# Patient Record
Sex: Female | Born: 1985 | Race: White | Hispanic: No | Marital: Single | Smoking: Never smoker
Health system: Southern US, Community
[De-identification: ages and names within clinical notes are randomized; demographics above are authoritative.]

## PROBLEM LIST (undated history)

## (undated) DIAGNOSIS — I10 Essential (primary) hypertension: Secondary | ICD-10-CM

## (undated) DIAGNOSIS — J449 Chronic obstructive pulmonary disease, unspecified: Secondary | ICD-10-CM

## (undated) DIAGNOSIS — M1990 Unspecified osteoarthritis, unspecified site: Secondary | ICD-10-CM

## (undated) DIAGNOSIS — D690 Allergic purpura: Secondary | ICD-10-CM

## (undated) DIAGNOSIS — R519 Headache, unspecified: Secondary | ICD-10-CM

## (undated) DIAGNOSIS — E119 Type 2 diabetes mellitus without complications: Secondary | ICD-10-CM

## (undated) DIAGNOSIS — J302 Other seasonal allergic rhinitis: Secondary | ICD-10-CM

## (undated) DIAGNOSIS — R51 Headache: Secondary | ICD-10-CM

---

## 2014-10-16 ENCOUNTER — Encounter (HOSPITAL_COMMUNITY): Payer: Self-pay | Admitting: *Deleted

## 2014-10-16 ENCOUNTER — Emergency Department (HOSPITAL_COMMUNITY)
Admission: EM | Admit: 2014-10-16 | Discharge: 2014-10-16 | Disposition: A | Discharge status: Home / Self Care | Payer: Self-pay | Attending: Emergency Medicine | Admitting: Emergency Medicine

## 2014-10-16 DIAGNOSIS — Z880 Allergy status to penicillin: Secondary | ICD-10-CM | POA: Insufficient documentation

## 2014-10-16 DIAGNOSIS — R739 Hyperglycemia, unspecified: Secondary | ICD-10-CM

## 2014-10-16 DIAGNOSIS — L02211 Cutaneous abscess of abdominal wall: Secondary | ICD-10-CM | POA: Insufficient documentation

## 2014-10-16 DIAGNOSIS — E1165 Type 2 diabetes mellitus with hyperglycemia: Secondary | ICD-10-CM | POA: Insufficient documentation

## 2014-10-16 DIAGNOSIS — Z794 Long term (current) use of insulin: Secondary | ICD-10-CM | POA: Insufficient documentation

## 2014-10-16 DIAGNOSIS — L0291 Cutaneous abscess, unspecified: Secondary | ICD-10-CM

## 2014-10-16 DIAGNOSIS — I10 Essential (primary) hypertension: Secondary | ICD-10-CM | POA: Insufficient documentation

## 2014-10-16 HISTORY — DX: Essential (primary) hypertension: I10

## 2014-10-16 HISTORY — DX: Type 2 diabetes mellitus without complications: E11.9

## 2014-10-16 HISTORY — DX: Other seasonal allergic rhinitis: J30.2

## 2014-10-16 LAB — CBG MONITORING, ED: GLUCOSE-CAPILLARY: 252 mg/dL — AB (ref 65–99)

## 2014-10-16 MED ORDER — LIDOCAINE-EPINEPHRINE (PF) 2 %-1:200000 IJ SOLN
20.0000 mL | Freq: Once | INTRAMUSCULAR | Status: AC
  Administered 2014-10-16: 20 mL
  Filled 2014-10-16 (×2): qty 20

## 2014-10-16 NOTE — ED Notes (Signed)
Pt left at this time with all belongings.  

## 2014-10-16 NOTE — ED Notes (Signed)
CBG 252 mg/dL reported to [employer] and Nelagoney PA

## 2014-10-16 NOTE — Discharge Instructions (Signed)
Abscess Care After An abscess (also called a boil or furuncle) is an infected area that contains a collection of pus. Signs and symptoms of an abscess include pain, tenderness, redness, or hardness, or you may feel a moveable soft area under your skin. An abscess can occur anywhere in the body. The infection may spread to surrounding tissues causing cellulitis. A cut (incision) by the surgeon was made over your abscess and the pus was drained out. Gauze may have been packed into the space to provide a drain that will allow the cavity to heal from the inside outwards. The boil may be painful for 5 to 7 days. Most people with a boil do not have high fevers. Your abscess, if seen early, may not have localized, and may not have been lanced. If not, another appointment may be required for this if it does not get better on its own or with medications. HOME CARE INSTRUCTIONS   Only take over-the-counter or prescription medicines for pain, discomfort, or fever as directed by your caregiver.  When you bathe, soak and then remove gauze or iodoform packs at least daily or as directed by your caregiver. You may then wash the wound gently with mild soapy water. Repack with gauze or do as your caregiver directs. SEEK IMMEDIATE MEDICAL CARE IF:   You develop increased pain, swelling, redness, drainage, or bleeding in the wound site.  You develop signs of generalized infection including muscle aches, chills, fever, or a general ill feeling.  An oral temperature above 102 F (38.9 C) develops, not controlled by medication. See your caregiver for a recheck if you develop any of the symptoms described above. If medications (antibiotics) were prescribed, take them as directed. Document Released: 09/03/2004 Document Revised: 05/10/2011 Document Reviewed: 05/01/2007 [facility] Patient Information 2015 [place], [place]. This information is not intended to replace advice given to you by your health care provider. Make sure  you discuss any questions you have with your health care provider.   Emergency Department Resource Guide 1) Find a Doctor and Pay Out of Pocket Although you won't have to find out who is covered by your insurance plan, it is a good idea to ask around and get recommendations. You will then need to call the office and see if the doctor you have chosen will accept you as a new patient and what types of options they offer for patients who are self-pay. Some doctors offer discounts or will set up payment plans for their patients who do not have insurance, but you will need to ask so you aren't surprised when you get to your appointment.  2) Contact Your Local Health Department Not all health departments have doctors that can see patients for sick visits, but many do, so it is worth a call to see if yours does. If you don't know where your local health department is, you can check in your phone book. The CDC also has a tool to help you locate your state's health department, and many state websites also have listings of all of their local health departments.  3) Find a [facility] If your illness is not likely to be very severe or complicated, you may want to try a walk in clinic. These are popping up all over the country in pharmacies, drugstores, and shopping centers. They're usually staffed by nurse practitioners or physician assistants that have been trained to treat common illnesses and complaints. They're usually fairly quick and inexpensive. However, if you have serious medical issues  or chronic medical problems, these are probably not your best option. ° °No Primary Care Doctor: °- Call [facility] Connect at  832-8000 - they can help you locate a primary care doctor that  accepts your insurance, provides certain services, etc. °- Physician Referral Service- 1-800-533-3463 ° °Chronic Pain Problems: °Organization         Address  Phone   Notes  °[facility]  (336) 297-2271 Patients need  to be referred by their primary care doctor.  ° °Medication Assistance: °Organization         Address  Phone   Notes  °[place] [facility] [address] °[place], [place] [address] (336) 641-8030 --Must be a resident of [place] °-- Must have NO insurance coverage whatsoever (no Medicaid/ Medicare, etc.) °-- The pt. MUST have a primary care doctor that directs their care regularly and follows them in the community °  °[facility]  (866) 331-1348   °[employer]  (888) 892-1162   ° °Agencies that provide inexpensive medical care: °Organization         Address  Phone   Notes  °[facility]  (336) 832-8035   °[facility]    (336) 832-7272   °[facility] [address] °[place], [place] [address] (336) 832-4777   °[facility] [address], °[place] (336) 271-4999   °[facility]    (336) 373-0678   °[facility]    (336) 272-1050   °[facility] ° [address], [place] Phone:  (336) 832-4444, Fax:  (336) 832-4440 Hours of Operation:  9 am - 6 pm, M-F.  Also accepts Medicaid/Medicare and self-pay.  °[facility] ° [address], [place] Phone: (336) 832-3150, Fax: (336) 832-3151. Hours of Operation:  8:30 am - 5:30 pm, M-F.  Also accepts Medicaid and self-pay.  °[facility] [place] [address], [place] Phone: (336) 878-6027   °[facility] [address], [place], [place] (336)723-1848, Ext. 123 Mondays & Thursdays: 7-9 AM.  First 15 patients are seen on a first come, first serve basis. °  ° °Medicaid-accepting [place] Providers: ° °Organization         Address  Phone   Notes  °[facility] [address], [place] (336) 641-2100 Also accepts self-pay patients.  °[facility] [address], [place] ° (336) 856-9996   °[facility] [address], [place] (336) 288-8857   °[facility] [address] [place] [address], [place] (336) 299-7000   °[facility] [address], [place]  ° (336) 373-1557 Only accepts [place] Access Medicaid patients after they have their name applied to their card.  ° °Self-Pay (no insurance) in [place]: ° °Organization         Address  Phone   Notes  °Sickle Cell Patients, [facility] [address], [place] (336) 832-1970   °[facility] [address], [place] (336) 832-4400   °[facility] ° [address], [place] (336) 992-4800   °[facility]/Dr. Osei-Bonsu ° [address] [place] [address], [place] or [address], [place] (336) 841-8500 Phone number for both [place] and  locations is the same.  °  [facility] [address], [place] (336) 299-0000   °[facility] [address] [place] [address], [place] or [address] (336) 852-7530 °(336) 878-2260   °[facility] [address], [place] (336) 350-1642, phone; (336) 294-5005, fax Sees patients 1st and 3rd Saturday of every month.  Must not qualify for public or private insurance (i.e. Medicaid, Medicare, [place] Health Choice, Veterans' Benefits) • Household income should be no more than 200% of the poverty level •The clinic cannot treat you if you are pregnant or think you are pregnant • Sexually transmitted diseases are not treated at the clinic.  ° ° °Dental Care: °Organization         Address  Phone  Notes  °[facility] [address], [place] (336) 641-6152 Accepts children up to age 21 who are enrolled in Medicaid or [place] Health Choice; pregnant women with a Medicaid card; and children who have applied for Medicaid or [place] Health Choice, but were declined, whose  parents can pay a reduced fee at time of service.  °[facility] [place]  [address], [place] (336) 641-7733 Accepts children up to age 21 who are enrolled in Medicaid or [place] Health Choice; pregnant women with a Medicaid card; and children who have applied for Medicaid or [place] Health Choice, but were declined, whose parents can pay a reduced fee at time of service.  °[facility] ° [address], [place] (336) 641-4533 Patients are seen by appointment only. Walk-ins are not accepted. [facility] will see patients 18 years of age and older. °Monday - Tuesday (8am-5pm) °Most Wednesdays (8:30-5pm) °$30 per visit, cash only  °[facility] ° [address], [place] (336) 641-4533 Patients are seen by appointment only. Walk-ins are not accepted. [facility] will see patients 18 years of age and older. °One Wednesday Evening (Monthly: Volunteer Based).  $30 per visit, cash only  °[facility]  (919) 537-3737 for adults; Children under age 4, call Graduate Pediatric Dentistry at (919) [phone]. Children aged 4-14, please call (919) [phone] to request a pediatric application. ° Dental services are provided in all areas of dental care including fillings, crowns and bridges, complete and partial dentures, implants, gum treatment, root canals, and extractions. Preventive care is also provided. Treatment is provided to both adults and children. °Patients are selected via a lottery and there is often a waiting list. °  °[facility] [address], ° ° (336) 763-8833 [email or web address] °  °[facility] [address], [place], [place] (336)723-1848, Ext. 123 Second and Fourth Thursday of each month, opens at 6:30 AM; Clinic ends at 9 AM.  Patients are seen on a first-come first-served basis, and a limited number are seen during each clinic.  ° °[facility] °  [address], [place], [place] (336) 723-7904   Eligibility Requirements °You must have lived in [place], [place], or [place] counties for at least the last three months. °  You cannot be eligible for state or federal sponsored healthcare insurance, including Veterans Administration, Medicaid, or Medicare. °  You generally cannot be eligible for healthcare insurance through your employer.  °  How to apply: °Eligibility screenings are held every Tuesday and Wednesday afternoon from 1:00 pm until 4:00 pm. You do not need an appointment for the interview!  °  [facility] [address], [place], [place] 336-631-2330   °[facility]  336-342-8273   °[facility]  336-703-3100   °[facility]  336-570-6415   ° °Behavioral Health Resources in the Community: °Intensive Outpatient Programs °Organization         Address  Phone  Notes  °[facility] [address], [place], [place] 336-878-6098   °[facility] [address], [place], [place] 336-832-9800   °[facility] [address], [place], [place] ° 336-882-2125   °[facility] [address],  °[place], [place] 1-800-853-5163 or 336-641-4981   °Substance Abuse Resources °Organization         Address  Phone  Notes  °[facility]  336-882-2125   °[facility]  336-784-9470   °[facility]  336-285-9073   °[facility]  336-845-3988   °Residential & Outpatient Substance Abuse Program  1-800-659-3381   °Psychological Services °Organization         Address  Phone  Notes  °[facility]  336- 832-9600   °[facility]  336- 378-7881   °[facility] [address], [place] 1-800-853-5163 or 336-641-4981   ° °Mobile Crisis Teams °Organization         Address  Phone  Notes  °[facility], Mobile Crisis Care Unit  1-877-626-1772     °Assertive °[facility] ° [address] [place], [place] 336-834-9664   °Sharon DeEsch [address] °[place] 336-554-5454   ° °Self-Help/Support Groups °Organization         Address  Phone             Notes  °[facility] - variety of support groups  336- 373-1402 Call for more information  °Narcotics Anonymous (NA), [facility] [address], °[place]  2 meetings at this location  ° °Residential Treatment Programs °Organization         Address  Phone  Notes  °[facility] [address],    °[place]  1-866-801-8205   °[facility] ° [address], [place], [place] 704-293-8524   °[facility] [address], [place] 336-845-3988 Admissions: 8am-3pm M-F  °[facility] [address]    °[place], [place] 336-841-1104   °[facility] [address], [place], [place] 336-379-7146   °[facility] [address]  °[place], [place] 336-285-9073   °[facility] Intensive Outpatient [address], [place], [place] 336-852-3033   °[facility] [address]  °[place], [place] 1-877-615-2722 or 336-784-9470   °[facility] [address] [place], [place] 336-227-7417 Accepts Medicaid  °[facility] [address]  °[place] 1-800-659-3381 Substance Abuse/Addiction Treatment  ° °[place] Behavioral Health Resources °Organization         Address  Phone  Notes  °[facility]  (888) 581-9988   °Julie Brannon, PhD [address] [place], [place]   (336) 349-5553 or (336) 951-0000   °[facility]   [address] °[place],  (336) 349-4454   °[facility] [address], [place],  (336) 342-8316 Insurance/Medicaid/sponsorship through [facility]  °[facility] [address]                                       [place], [place] [phone] Therapy/tele-psych/case  [facility] [address]   [place], [place] [phone]    Dr. Lolly Mustache  724-739-0883   [facility]  [employer] [facility] Dept. 1) [address],  2) [address], [place] 3)  [address], [place] [phone]) [phone]  306-099-7141   [facility] Child Abuse Hotline 272-882-9294 or 714-143-4226 (After Hours)       Please monitor for new or worsening signs of infection. Follow-up immediately if any present. Please monitor your blood sugar, take your insulin as directed.

## 2014-10-16 NOTE — ED Provider Notes (Signed)
CSN: [id number]     Arrival date & time 10/16/14  1702 History  This chart was scribed for Eyvonne Mechanic, PA-C working with Donnetta Hutching, MD by Evon Slack, ED Scribe. This patient was seen in room TR11C/TR11C and the patient's care was started at 5:40 PM.      Chief Complaint  Patient presents with  . Recurrent Skin Infections   The history is provided by the patient. No language interpreter was used.   HPI Comments:   Diane Nguyen is a 29 y.o. female who presents to the Emergency Department complaining of abscess to her lower abdomen onset 2 days prior. Pt states that she tried to pop the abscess with no relief. Pt denies fever, chills, nausea or vomiting.  Pt states that she has a Hx of DM and that her blood sugars not controlled. She states she has not checked her blood sugar in over 2 months. Pt states that she has not been compliant with her medications, she states that she only takes her insulin 1x per day.     Past Medical History  Diagnosis Date  . Hypertension   . Diabetes mellitus without complication   . Seasonal allergies    History reviewed. No pertinent past surgical history. No family history on file. Social History  Substance Use Topics  . Smoking status: Never Smoker   . Smokeless tobacco: None  . Alcohol Use: Yes   OB History    No data available     Review of Systems  All other systems reviewed and are negative.   Allergies  Bee venom; Penicillins; Soy allergy; and Sulfur  Home Medications   Prior to Admission medications   Not on File   BP 100/66 mmHg  Pulse 96  Temp(Src) 98.4 F (36.9 C) (Oral)  Resp 20  Ht  (1.651 m)  Wt 264 lb 14.4 oz (120.158 kg)  BMI 44.08 kg/m2  SpO2 100%  LMP 09/05/2014   Physical Exam  Constitutional: She is oriented to person, place, and time. She appears well-developed and well-nourished. No distress.  HENT:  Head: Normocephalic and atraumatic.  Eyes: Conjunctivae and EOM are normal.  Neck: Neck  supple. No tracheal deviation present.  Cardiovascular: Normal rate.   Pulmonary/Chest: Effort normal. No respiratory distress.  Musculoskeletal: Normal range of motion.  Neurological: She is alert and oriented to person, place, and time.  Skin: Skin is warm and dry.  Small abscess on lower with small amount of induration no surrounding cellulitis, fluctuance, erythema or red streaking.   Psychiatric: She has a normal mood and affect. Her behavior is normal.  Nursing note and vitals reviewed.   ED Course  Procedures (including critical care time)   INCISION AND DRAINAGE PROCEDURE NOTE: Patient identification was confirmed and verbal consent was obtained. This procedure was performed by Eyvonne Mechanic, PA-C at 6:10 PM. Site: lower abdomen Sterile procedures observed: Yes Needle size: 27 Anesthetic used (type and amt): Lidocaine 2% with Epi Blade size: 11 Drainage: .5 cc Complexity: Complex Packing used: none Site anesthetized, incision made over site, wound drained and explored loculations, rinsed with copious amounts of normal saline, wound packed with sterile gauze, covered with dry, sterile dressing.  Pt tolerated procedure well without complications.  Instructions for care discussed verbally and pt provided with additional written instructions for homecare and f/u.  Labs Review Labs Reviewed  CBG MONITORING, ED - Abnormal; Notable for the following:    Glucose-Capillary 252 (*)    All other components  within normal limits    Imaging Review No results found. I have personally reviewed and evaluated these images and lab results as part of my medical decision-making.   EKG Interpretation None      MDM   Final diagnoses:  Abscess  Hyperglycemia    Labs: CBG- 252  Imaging:   Consults:  Therapeutics:   Discharge Meds:   Assessment/Plan: Patient here for abscess to her abdomen, no signs of surrounding cellulitis. Minimal purulent drainage, patient given  wound care instructions. Patient reports she is a diabetic and has not been checking her blood sugar, pointing care today showed 252, she has no changes in vision, abdominal pain, increased thirst or urination. Patient does have her diabetic medication but has not been taking it twice daily as indicated as she has limited supply. Patient encouraged to take her medication as directed, follow-up with primary care for reevaluation and assistance with obtaining medications. Patient reports she does have a prescription, but is unable to afford it.       Eyvonne Mechanic, PA-C 10/16/14 1943  Elwin Mocha, MD 10/17/14 [phone]

## 2014-10-16 NOTE — ED Notes (Signed)
Pt noted to have an asbcess to left lower abdomen.  Pt states that she has hx of same.

## 2015-03-05 ENCOUNTER — Other Ambulatory Visit: Payer: Self-pay | Admitting: Internal Medicine

## 2017-08-03 ENCOUNTER — Other Ambulatory Visit: Payer: Self-pay | Admitting: Neurosurgery

## 2017-08-23 NOTE — Pre-Procedure Instructions (Addendum)
Tulsi Crossett  08/23/2017      [id number] - [place], [place] - [address] [facility] [place] [address] Phone: [phone] Fax: [phone]    Your procedure is scheduled on Tues., August 30, 2017 from 8:00AM-10:42AM  Report to [facility] Admitting Entrance "A" at 6:00AM  Call this number if you have problems the morning of surgery:  [phone]   Remember:  Do not eat or drink after midnight on July 1st    Take these medicines the morning of surgery with A SIP OF WATER: If needed Omeprazole (PRILOSEC OTC), Eye Itch Relief, Fluticasone (FLONASE), Cetirizine (ZYRTEC),  and Albuterol Inhaler (Bring with you the day of surgery)  As of today, stop taking all Other Aspirin Products, Vitamins, Fish oils, and Herbal medications. Also stop all NSAIDS i.e. Advil, Ibuprofen, Motrin, Aleve, Anaprox, Naproxen, BC, Goody Powders, and all Supplements.  How to Manage Your Diabetes Before and After Surgery  Why is it important to control my blood sugar before and after surgery? . Improving blood sugar levels before and after surgery helps healing and can limit problems. . A way of improving blood sugar control is eating a healthy diet by: o  Eating less sugar and carbohydrates o  Increasing activity/exercise o  Talking with your doctor about reaching your blood sugar goals . High blood sugars (greater than 180 mg/dL) can raise your risk of infections and slow your recovery, so you will need to focus on controlling your diabetes during the weeks before surgery. . Make sure that the doctor who takes care of your diabetes knows about your planned surgery including the date and location.  How do I manage my blood sugar before surgery? . Check your blood sugar at least 4 times a day, starting 2 days before surgery, to make sure that the level is not too high or low. o Check your blood sugar the morning of your surgery when you wake up and every 2 hours  until you get to the Short Stay unit. . If your blood sugar is less than 70 mg/dL, you will need to treat for low blood sugar: o Do not take insulin. o Treat a low blood sugar (less than 70 mg/dL) with  cup of clear juice (cranberry or apple), 4 glucose tablets, OR glucose gel. Recheck blood sugar in 15 minutes after treatment (to make sure it is greater than 70 mg/dL). If your blood sugar is not greater than 70 mg/dL on recheck, call [phone] o  for further instructions. . Report your blood sugar to the short stay nurse when you get to Short Stay.  . If you are admitted to the hospital after surgery: o Your blood sugar will be checked by the staff and you will probably be given insulin after surgery (instead of oral diabetes medicines) to make sure you have good blood sugar levels. o The goal for blood sugar control after surgery is 80-180 mg/dL.  WHAT DO I DO ABOUT MY DIABETES MEDICATION?  Marland Kitchen Do not take GlipiZIDE (GLUCOTROL), JARDIANCE, and MetFORMIN (GLUCOPHAGE) the morning of surgery.  . If your CBG is greater than 220 mg/dL, inform the staff upon arrival to Short Stay  Reviewed and Endorsed by [facility] Patient Education Committee, August 2015   Do not wear jewelry, make-up or nail polish.  Do not wear lotions, powders, or perfumes, or deodorant.  Do not shave 48 hours prior to surgery.    Do not bring  valuables to the hospital.  [facility] is not responsible for any belongings or valuables.  Contacts, dentures or bridgework may not be worn into surgery.  Leave your suitcase in the car.  After surgery it may be brought to your room.  For patients admitted to the hospital, discharge time will be determined by your treatment team.  Patients discharged the day of surgery will not be allowed to drive home.   Special instructions:   [place]- Preparing For Surgery  Before surgery, you can play an important role. Because skin is not sterile, your skin needs to be as free  of germs as possible. You can reduce the number of germs on your skin by washing with CHG (chlorahexidine gluconate) Soap before surgery.  CHG is an antiseptic cleaner which kills germs and bonds with the skin to continue killing germs even after washing.    Oral Hygiene is also important to reduce your risk of infection.  Remember - BRUSH YOUR TEETH THE MORNING OF SURGERY WITH YOUR REGULAR TOOTHPASTE  Please do not use if you have an allergy to CHG or antibacterial soaps. If your skin becomes reddened/irritated stop using the CHG.  Do not shave (including legs and underarms) for at least 48 hours prior to first CHG shower. It is OK to shave your face.  Please follow these instructions carefully.   1. Shower the NIGHT BEFORE SURGERY and the MORNING OF SURGERY with CHG.   2. If you chose to wash your hair, wash your hair first as usual with your normal shampoo.  3. After you shampoo, rinse your hair and body thoroughly to remove the shampoo.  4. Use CHG as you would any other liquid soap. You can apply CHG directly to the skin and wash gently with a scrungie or a clean washcloth.   5. Apply the CHG Soap to your body ONLY FROM THE NECK DOWN.  Do not use on open wounds or open sores. Avoid contact with your eyes, ears, mouth and genitals (private parts). Wash Face and genitals (private parts)  with your normal soap.  6. Wash thoroughly, paying special attention to the area where your surgery will be performed.  7. Thoroughly rinse your body with warm water from the neck down.  8. DO NOT shower/wash with your normal soap after using and rinsing off the CHG Soap.  9. Pat yourself dry with a CLEAN TOWEL.  10. Wear CLEAN PAJAMAS to bed the night before surgery, wear comfortable clothes the morning of surgery  11. Place CLEAN SHEETS on your bed the night of your first shower and DO NOT SLEEP WITH PETS.  Day of Surgery:  Do not apply any deodorants/lotions.  Please wear clean clothes to the  hospital/surgery center.   Remember to brush your teeth WITH YOUR REGULAR TOOTHPASTE.  Please read over the following fact sheets that you were given. Pain Booklet, Coughing and Deep Breathing, MRSA Information and Surgical Site Infection Prevention

## 2017-08-24 ENCOUNTER — Inpatient Hospital Stay (HOSPITAL_COMMUNITY)
Admission: RE | Admit: 2017-08-24 | Discharge: 2017-08-24 | Disposition: A | Discharge status: Home / Self Care | Payer: Self-pay | Source: Ambulatory Visit

## 2017-08-29 ENCOUNTER — Encounter (HOSPITAL_COMMUNITY)
Admission: RE | Admit: 2017-08-29 | Discharge: 2017-08-29 | Disposition: A | Discharge status: Home / Self Care | Payer: Medicaid Other | Source: Ambulatory Visit | Attending: Neurosurgery | Admitting: Neurosurgery

## 2017-08-29 ENCOUNTER — Encounter (HOSPITAL_COMMUNITY): Payer: Self-pay

## 2017-08-29 ENCOUNTER — Other Ambulatory Visit: Payer: Self-pay

## 2017-08-29 HISTORY — DX: Headache: R51

## 2017-08-29 HISTORY — DX: Headache, unspecified: R51.9

## 2017-08-29 HISTORY — DX: Unspecified osteoarthritis, unspecified site: M19.90

## 2017-08-29 HISTORY — DX: Chronic obstructive pulmonary disease, unspecified: J44.9

## 2017-08-29 HISTORY — DX: Allergic purpura: D69.0

## 2017-08-29 LAB — CBC WITH DIFFERENTIAL/PLATELET
Abs Immature Granulocytes: 0 10*3/uL (ref 0.0–0.1)
BASOS ABS: 0.1 10*3/uL (ref 0.0–0.1)
BASOS PCT: 1 %
EOS ABS: 0.1 10*3/uL (ref 0.0–0.7)
EOS PCT: 1 %
HCT: 46.6 % — ABNORMAL HIGH (ref 36.0–46.0)
Hemoglobin: 15.2 g/dL — ABNORMAL HIGH (ref 12.0–15.0)
Immature Granulocytes: 0 %
Lymphocytes Relative: 40 %
Lymphs Abs: 3.5 10*3/uL (ref 0.7–4.0)
MCH: 27.5 pg (ref 26.0–34.0)
MCHC: 32.6 g/dL (ref 30.0–36.0)
MCV: 84.4 fL (ref 78.0–100.0)
Monocytes Absolute: 0.6 10*3/uL (ref 0.1–1.0)
Monocytes Relative: 7 %
NEUTROS PCT: 51 %
Neutro Abs: 4.4 10*3/uL (ref 1.7–7.7)
PLATELETS: 296 10*3/uL (ref 150–400)
RBC: 5.52 MIL/uL — AB (ref 3.87–5.11)
RDW: 12.3 % (ref 11.5–15.5)
WBC: 8.7 10*3/uL (ref 4.0–10.5)

## 2017-08-29 LAB — TYPE AND SCREEN
ABO/RH(D): B POS
ANTIBODY SCREEN: NEGATIVE

## 2017-08-29 LAB — GLUCOSE, CAPILLARY: Glucose-Capillary: 174 mg/dL — ABNORMAL HIGH (ref 70–99)

## 2017-08-29 LAB — SURGICAL PCR SCREEN
MRSA, PCR: NEGATIVE
Staphylococcus aureus: POSITIVE — AB

## 2017-08-29 LAB — BASIC METABOLIC PANEL
ANION GAP: 10 (ref 5–15)
BUN: 11 mg/dL (ref 6–20)
CHLORIDE: 105 mmol/L (ref 98–111)
CO2: 23 mmol/L (ref 22–32)
Calcium: 9.1 mg/dL (ref 8.9–10.3)
Creatinine, Ser: 0.59 mg/dL (ref 0.44–1.00)
GFR calc Af Amer: >60 mL/min (ref >60)
Glucose, Bld: 151 mg/dL — ABNORMAL HIGH (ref 70–99)
POTASSIUM: 4 mmol/L (ref 3.5–5.1)
SODIUM: 138 mmol/L (ref 135–145)

## 2017-08-29 LAB — ABO/RH: ABO/RH(D): B POS

## 2017-08-29 MED ORDER — VANCOMYCIN HCL 10 G IV SOLR
1500.0000 mg | INTRAVENOUS | Status: AC
  Administered 2017-08-30 (×2): 1500 mg via INTRAVENOUS
  Filled 2017-08-29: qty 1500

## 2017-08-29 NOTE — Progress Notes (Addendum)
PCP: Cletis AthensMelissa Rose, NP  Cardiologist: pt denies  EKG: 08/29/17  Stress test: pt denies  ECHO: pt denies  Cardiac Cath: pt denies  Chest x-ray: pt denies

## 2017-08-30 ENCOUNTER — Inpatient Hospital Stay (HOSPITAL_COMMUNITY): Payer: Medicaid Other

## 2017-08-30 ENCOUNTER — Inpatient Hospital Stay (HOSPITAL_COMMUNITY): Payer: Medicaid Other | Admitting: Anesthesiology

## 2017-08-30 ENCOUNTER — Inpatient Hospital Stay (HOSPITAL_COMMUNITY): Payer: Medicaid Other | Admitting: Emergency Medicine

## 2017-08-30 ENCOUNTER — Inpatient Hospital Stay (HOSPITAL_COMMUNITY)
Admission: RE | Admit: 2017-08-30 | Discharge: 2017-08-31 | DRG: 454 | Disposition: A | Discharge status: Home / Self Care | Payer: Medicaid Other | Attending: Neurosurgery | Admitting: Neurosurgery

## 2017-08-30 ENCOUNTER — Encounter (HOSPITAL_COMMUNITY): Payer: Self-pay | Admitting: Anesthesiology

## 2017-08-30 ENCOUNTER — Inpatient Hospital Stay (HOSPITAL_COMMUNITY)
Admission: RE | Disposition: A | Discharge status: Home / Self Care | Payer: Self-pay | Source: Home / Self Care | Attending: Neurosurgery

## 2017-08-30 DIAGNOSIS — M1990 Unspecified osteoarthritis, unspecified site: Secondary | ICD-10-CM | POA: Diagnosis present

## 2017-08-30 DIAGNOSIS — I10 Essential (primary) hypertension: Secondary | ICD-10-CM | POA: Diagnosis present

## 2017-08-30 DIAGNOSIS — Z880 Allergy status to penicillin: Secondary | ICD-10-CM | POA: Diagnosis not present

## 2017-08-30 DIAGNOSIS — E669 Obesity, unspecified: Secondary | ICD-10-CM | POA: Diagnosis present

## 2017-08-30 DIAGNOSIS — E119 Type 2 diabetes mellitus without complications: Secondary | ICD-10-CM | POA: Diagnosis present

## 2017-08-30 DIAGNOSIS — Z419 Encounter for procedure for purposes other than remedying health state, unspecified: Secondary | ICD-10-CM

## 2017-08-30 DIAGNOSIS — M4317 Spondylolisthesis, lumbosacral region: Secondary | ICD-10-CM | POA: Diagnosis present

## 2017-08-30 DIAGNOSIS — Z7984 Long term (current) use of oral hypoglycemic drugs: Secondary | ICD-10-CM

## 2017-08-30 DIAGNOSIS — J449 Chronic obstructive pulmonary disease, unspecified: Secondary | ICD-10-CM | POA: Diagnosis present

## 2017-08-30 DIAGNOSIS — M4800 Spinal stenosis, site unspecified: Secondary | ICD-10-CM | POA: Diagnosis present

## 2017-08-30 DIAGNOSIS — Z6841 Body Mass Index (BMI) 40.0 and over, adult: Secondary | ICD-10-CM | POA: Diagnosis not present

## 2017-08-30 DIAGNOSIS — Z882 Allergy status to sulfonamides status: Secondary | ICD-10-CM | POA: Diagnosis not present

## 2017-08-30 DIAGNOSIS — M5117 Intervertebral disc disorders with radiculopathy, lumbosacral region: Secondary | ICD-10-CM | POA: Diagnosis present

## 2017-08-30 DIAGNOSIS — Z79899 Other long term (current) drug therapy: Secondary | ICD-10-CM | POA: Diagnosis not present

## 2017-08-30 DIAGNOSIS — M549 Dorsalgia, unspecified: Secondary | ICD-10-CM | POA: Diagnosis present

## 2017-08-30 DIAGNOSIS — Z91018 Allergy to other foods: Secondary | ICD-10-CM | POA: Diagnosis not present

## 2017-08-30 DIAGNOSIS — Z91030 Bee allergy status: Secondary | ICD-10-CM

## 2017-08-30 DIAGNOSIS — Z9109 Other allergy status, other than to drugs and biological substances: Secondary | ICD-10-CM

## 2017-08-30 LAB — GLUCOSE, CAPILLARY
GLUCOSE-CAPILLARY: 133 mg/dL — AB (ref 70–99)
GLUCOSE-CAPILLARY: 257 mg/dL — AB (ref 70–99)
GLUCOSE-CAPILLARY: 274 mg/dL — AB (ref 70–99)
GLUCOSE-CAPILLARY: 259 mg/dL — AB (ref 70–99)
GLUCOSE-CAPILLARY: 252 mg/dL — AB (ref 70–99)

## 2017-08-30 LAB — POCT PREGNANCY, URINE: PREG TEST UR: NEGATIVE

## 2017-08-30 SURGERY — POSTERIOR LUMBAR FUSION 1 LEVEL
Anesthesia: General | Site: Back

## 2017-08-30 MED ORDER — HYDROMORPHONE HCL 1 MG/ML IJ SOLN
INTRAMUSCULAR | Status: AC
  Filled 2017-08-30: qty 1

## 2017-08-30 MED ORDER — LIDOCAINE 2% (20 MG/ML) 5 ML SYRINGE
INTRAMUSCULAR | Status: AC
  Filled 2017-08-30: qty 5

## 2017-08-30 MED ORDER — BISACODYL 10 MG RE SUPP: 10.0000 mg | Freq: Every day | RECTAL | Status: DC | PRN

## 2017-08-30 MED ORDER — OMEPRAZOLE MAGNESIUM 20 MG PO TBEC: 20.0000 mg | DELAYED_RELEASE_TABLET | Freq: Every day | ORAL | Status: DC | PRN

## 2017-08-30 MED ORDER — EPHEDRINE SULFATE 50 MG/ML IJ SOLN
INTRAMUSCULAR | Status: DC | PRN
  Administered 2017-08-30: 5 mg via INTRAVENOUS
  Administered 2017-08-30: 10 mg via INTRAVENOUS
  Administered 2017-08-30: 25 mg via INTRAVENOUS
  Administered 2017-08-30: 10 mg via INTRAVENOUS

## 2017-08-30 MED ORDER — ROCURONIUM BROMIDE 10 MG/ML (PF) SYRINGE
PREFILLED_SYRINGE | INTRAVENOUS | Status: AC
  Filled 2017-08-30: qty 10

## 2017-08-30 MED ORDER — MIDAZOLAM HCL 5 MG/5ML IJ SOLN
INTRAMUSCULAR | Status: DC | PRN
  Administered 2017-08-30 (×2): 1 mg via INTRAVENOUS

## 2017-08-30 MED ORDER — POLYETHYLENE GLYCOL 3350 17 G PO PACK: 17.0000 g | PACK | Freq: Every day | ORAL | Status: DC | PRN

## 2017-08-30 MED ORDER — DEXAMETHASONE SODIUM PHOSPHATE 10 MG/ML IJ SOLN
10.0000 mg | INTRAMUSCULAR | Status: AC
  Administered 2017-08-30: 10 mg via INTRAVENOUS
  Filled 2017-08-30: qty 1

## 2017-08-30 MED ORDER — PHENYLEPHRINE HCL 10 MG/ML IJ SOLN
INTRAMUSCULAR | Status: DC | PRN
  Administered 2017-08-30 (×2): 120 ug via INTRAVENOUS
  Administered 2017-08-30 (×3): 80 ug via INTRAVENOUS

## 2017-08-30 MED ORDER — ONDANSETRON HCL 4 MG PO TABS: 4.0000 mg | ORAL_TABLET | Freq: Four times a day (QID) | ORAL | Status: DC | PRN

## 2017-08-30 MED ORDER — PROPOFOL 10 MG/ML IV BOLUS
INTRAVENOUS | Status: AC
  Filled 2017-08-30: qty 40

## 2017-08-30 MED ORDER — ESMOLOL HCL 100 MG/10ML IV SOLN
INTRAVENOUS | Status: DC | PRN
  Administered 2017-08-30: 30 mg via INTRAVENOUS

## 2017-08-30 MED ORDER — CHLORHEXIDINE GLUCONATE CLOTH 2 % EX PADS: 6.0000 | MEDICATED_PAD | Freq: Once | CUTANEOUS | Status: DC

## 2017-08-30 MED ORDER — VANCOMYCIN HCL 1 G IV SOLR
INTRAVENOUS | Status: DC | PRN
  Administered 2017-08-30: 1000 mg

## 2017-08-30 MED ORDER — INSULIN ASPART 100 UNIT/ML ~~LOC~~ SOLN
5.0000 [IU] | Freq: Once | SUBCUTANEOUS | Status: AC
  Administered 2017-08-30: 5 [IU] via SUBCUTANEOUS

## 2017-08-30 MED ORDER — LACTATED RINGERS IV SOLN
INTRAVENOUS | Status: DC
  Administered 2017-08-30 (×2): via INTRAVENOUS

## 2017-08-30 MED ORDER — ROCURONIUM BROMIDE 100 MG/10ML IV SOLN
INTRAVENOUS | Status: DC | PRN
  Administered 2017-08-30: 20 mg via INTRAVENOUS
  Administered 2017-08-30 (×2): 30 mg via INTRAVENOUS
  Administered 2017-08-30: 50 mg via INTRAVENOUS

## 2017-08-30 MED ORDER — 0.9 % SODIUM CHLORIDE (POUR BTL) OPTIME
TOPICAL | Status: DC | PRN
  Administered 2017-08-30: 1000 mL

## 2017-08-30 MED ORDER — THROMBIN 20000 UNITS EX SOLR
CUTANEOUS | Status: AC
  Filled 2017-08-30: qty 20000

## 2017-08-30 MED ORDER — BUPIVACAINE HCL (PF) 0.25 % IJ SOLN
INTRAMUSCULAR | Status: AC
  Filled 2017-08-30: qty 30

## 2017-08-30 MED ORDER — SODIUM CHLORIDE 0.9 % IV SOLN: 250.0000 mL | INTRAVENOUS | Status: DC

## 2017-08-30 MED ORDER — DEXAMETHASONE SODIUM PHOSPHATE 10 MG/ML IJ SOLN
INTRAMUSCULAR | Status: AC
  Filled 2017-08-30: qty 1

## 2017-08-30 MED ORDER — THROMBIN 20000 UNITS EX SOLR
CUTANEOUS | Status: DC | PRN
  Administered 2017-08-30: 09:00:00 via TOPICAL

## 2017-08-30 MED ORDER — GLIPIZIDE 10 MG PO TABS
10.0000 mg | ORAL_TABLET | Freq: Two times a day (BID) | ORAL | Status: DC
  Administered 2017-08-30 – 2017-08-31 (×2): 10 mg via ORAL
  Filled 2017-08-30 (×2): qty 1

## 2017-08-30 MED ORDER — HYDROMORPHONE HCL 1 MG/ML IJ SOLN: 1.0000 mg | INTRAMUSCULAR | Status: DC | PRN

## 2017-08-30 MED ORDER — MENTHOL 3 MG MT LOZG: 1.0000 | LOZENGE | OROMUCOSAL | Status: DC | PRN

## 2017-08-30 MED ORDER — FENTANYL CITRATE (PF) 250 MCG/5ML IJ SOLN
INTRAMUSCULAR | Status: AC
Start: 2017-08-30 — End: ?
  Filled 2017-08-30: qty 5

## 2017-08-30 MED ORDER — EPHEDRINE SULFATE 50 MG/ML IJ SOLN
INTRAMUSCULAR | Status: AC
  Filled 2017-08-30: qty 1

## 2017-08-30 MED ORDER — LISINOPRIL 10 MG PO TABS
10.0000 mg | ORAL_TABLET | Freq: Every day | ORAL | Status: DC
Start: 2017-08-30 — End: 2017-08-31
  Administered 2017-08-31: 10 mg via ORAL
  Filled 2017-08-30: qty 1

## 2017-08-30 MED ORDER — FENTANYL CITRATE (PF) 100 MCG/2ML IJ SOLN
INTRAMUSCULAR | Status: DC | PRN
  Administered 2017-08-30 (×3): 50 ug via INTRAVENOUS
  Administered 2017-08-30: 100 ug via INTRAVENOUS

## 2017-08-30 MED ORDER — ONDANSETRON HCL 4 MG/2ML IJ SOLN: 4.0000 mg | Freq: Four times a day (QID) | INTRAMUSCULAR | Status: DC | PRN

## 2017-08-30 MED ORDER — HYDROMORPHONE HCL 1 MG/ML IJ SOLN
0.2500 mg | INTRAMUSCULAR | Status: DC | PRN
  Administered 2017-08-30 (×4): 0.5 mg via INTRAVENOUS

## 2017-08-30 MED ORDER — SUGAMMADEX SODIUM 500 MG/5ML IV SOLN
INTRAVENOUS | Status: DC | PRN
  Administered 2017-08-30: 500 mg via INTRAVENOUS

## 2017-08-30 MED ORDER — ALBUMIN HUMAN 5 % IV SOLN
INTRAVENOUS | Status: DC | PRN
  Administered 2017-08-30: 10:00:00 via INTRAVENOUS

## 2017-08-30 MED ORDER — METFORMIN HCL 500 MG PO TABS
1000.0000 mg | ORAL_TABLET | Freq: Two times a day (BID) | ORAL | Status: DC
  Administered 2017-08-30 – 2017-08-31 (×2): 1000 mg via ORAL
  Filled 2017-08-30 (×2): qty 2

## 2017-08-30 MED ORDER — SODIUM CHLORIDE 0.9 % IV SOLN
INTRAVENOUS | Status: DC | PRN
  Administered 2017-08-30: 500 mL

## 2017-08-30 MED ORDER — KETOTIFEN FUMARATE 0.025 % OP SOLN: 1.0000 [drp] | Freq: Every day | OPHTHALMIC | Status: DC | PRN

## 2017-08-30 MED ORDER — INSULIN ASPART 100 UNIT/ML ~~LOC~~ SOLN
0.0000 [IU] | Freq: Three times a day (TID) | SUBCUTANEOUS | Status: DC
  Administered 2017-08-31: 3 [IU] via SUBCUTANEOUS

## 2017-08-30 MED ORDER — ACETAMINOPHEN 650 MG RE SUPP: 650.0000 mg | RECTAL | Status: DC | PRN

## 2017-08-30 MED ORDER — VANCOMYCIN HCL 1000 MG IV SOLR
INTRAVENOUS | Status: AC
  Filled 2017-08-30: qty 1000

## 2017-08-30 MED ORDER — CANAGLIFLOZIN 100 MG PO TABS
100.0000 mg | ORAL_TABLET | Freq: Every day | ORAL | Status: DC
  Administered 2017-08-31: 100 mg via ORAL
  Filled 2017-08-30: qty 1

## 2017-08-30 MED ORDER — CYCLOBENZAPRINE HCL 10 MG PO TABS: 10.0000 mg | ORAL_TABLET | Freq: Every evening | ORAL | Status: DC | PRN

## 2017-08-30 MED ORDER — PHENOL 1.4 % MT LIQD: 1.0000 | OROMUCOSAL | Status: DC | PRN

## 2017-08-30 MED ORDER — ONDANSETRON HCL 4 MG/2ML IJ SOLN
INTRAMUSCULAR | Status: AC
  Filled 2017-08-30: qty 2

## 2017-08-30 MED ORDER — PHENYLEPHRINE 40 MCG/ML (10ML) SYRINGE FOR IV PUSH (FOR BLOOD PRESSURE SUPPORT)
PREFILLED_SYRINGE | INTRAVENOUS | Status: AC
  Filled 2017-08-30: qty 20

## 2017-08-30 MED ORDER — MIDAZOLAM HCL 2 MG/2ML IJ SOLN
INTRAMUSCULAR | Status: AC
  Filled 2017-08-30: qty 2

## 2017-08-30 MED ORDER — LIDOCAINE HCL (CARDIAC) PF 100 MG/5ML IV SOSY
PREFILLED_SYRINGE | INTRAVENOUS | Status: DC | PRN
  Administered 2017-08-30: 40 mg via INTRAVENOUS

## 2017-08-30 MED ORDER — OXYCODONE HCL 5 MG PO TABS
10.0000 mg | ORAL_TABLET | ORAL | Status: DC | PRN
  Administered 2017-08-30 – 2017-08-31 (×5): 10 mg via ORAL
  Filled 2017-08-30 (×5): qty 2

## 2017-08-30 MED ORDER — ACETAMINOPHEN 325 MG PO TABS
650.0000 mg | ORAL_TABLET | ORAL | Status: DC | PRN
  Administered 2017-08-30 – 2017-08-31 (×2): 650 mg via ORAL
  Filled 2017-08-30 (×2): qty 2

## 2017-08-30 MED ORDER — INSULIN ASPART 100 UNIT/ML ~~LOC~~ SOLN
0.0000 [IU] | Freq: Every day | SUBCUTANEOUS | Status: DC
  Administered 2017-08-30: 3 [IU] via SUBCUTANEOUS

## 2017-08-30 MED ORDER — FLEET ENEMA 7-19 GM/118ML RE ENEM: 1.0000 | ENEMA | Freq: Once | RECTAL | Status: DC | PRN

## 2017-08-30 MED ORDER — FLUTICASONE PROPIONATE 50 MCG/ACT NA SUSP
1.0000 | Freq: Every day | NASAL | Status: DC | PRN
  Filled 2017-08-30: qty 16

## 2017-08-30 MED ORDER — INSULIN ASPART 100 UNIT/ML ~~LOC~~ SOLN
0.0000 [IU] | Freq: Three times a day (TID) | SUBCUTANEOUS | Status: DC
  Administered 2017-08-30: 11 [IU] via SUBCUTANEOUS

## 2017-08-30 MED ORDER — PROPOFOL 10 MG/ML IV BOLUS
INTRAVENOUS | Status: DC | PRN
  Administered 2017-08-30: 170 mg via INTRAVENOUS

## 2017-08-30 MED ORDER — BIOTIN 1000 MCG PO TABS: 1000.0000 ug | ORAL_TABLET | Freq: Every day | ORAL | Status: DC

## 2017-08-30 MED ORDER — SODIUM CHLORIDE 0.9% FLUSH
3.0000 mL | Freq: Two times a day (BID) | INTRAVENOUS | Status: DC
  Administered 2017-08-31: 3 mL via INTRAVENOUS

## 2017-08-30 MED ORDER — SODIUM CHLORIDE 0.9% FLUSH: 3.0000 mL | INTRAVENOUS | Status: DC | PRN

## 2017-08-30 MED ORDER — HYDROCODONE-ACETAMINOPHEN 10-325 MG PO TABS
1.0000 | ORAL_TABLET | ORAL | Status: DC | PRN
  Administered 2017-08-31: 1 via ORAL
  Filled 2017-08-30: qty 1

## 2017-08-30 MED ORDER — ALBUTEROL SULFATE HFA 108 (90 BASE) MCG/ACT IN AERS: 2.0000 | INHALATION_SPRAY | Freq: Four times a day (QID) | RESPIRATORY_TRACT | Status: DC | PRN

## 2017-08-30 MED ORDER — BUPIVACAINE HCL (PF) 0.25 % IJ SOLN
INTRAMUSCULAR | Status: DC | PRN
  Administered 2017-08-30: 30 mL

## 2017-08-30 MED ORDER — INSULIN ASPART 100 UNIT/ML ~~LOC~~ SOLN
3.0000 [IU] | Freq: Once | SUBCUTANEOUS | Status: AC
  Administered 2017-08-30: 3 [IU] via SUBCUTANEOUS

## 2017-08-30 MED ORDER — DIAZEPAM 5 MG PO TABS
5.0000 mg | ORAL_TABLET | Freq: Four times a day (QID) | ORAL | Status: DC | PRN
  Administered 2017-08-30 (×2): 5 mg via ORAL
  Filled 2017-08-30 (×2): qty 1

## 2017-08-30 MED ORDER — LORATADINE 10 MG PO TABS
10.0000 mg | ORAL_TABLET | Freq: Every day | ORAL | Status: DC
Start: 2017-08-31 — End: 2017-08-31
  Administered 2017-08-31: 10 mg via ORAL
  Filled 2017-08-30: qty 1

## 2017-08-30 MED ORDER — ATORVASTATIN CALCIUM 20 MG PO TABS
20.0000 mg | ORAL_TABLET | Freq: Every day | ORAL | Status: DC
  Administered 2017-08-30: 20 mg via ORAL
  Filled 2017-08-30: qty 1

## 2017-08-30 SURGICAL SUPPLY — 58 items
BAG DECANTER FOR FLEXI CONT (MISCELLANEOUS) ×3 IMPLANT
BENZOIN TINCTURE PRP APPL 2/3 (GAUZE/BANDAGES/DRESSINGS) ×3 IMPLANT
BLADE CLIPPER SURG (BLADE) IMPLANT
BUR CUTTER 7.0 ROUND (BURR) IMPLANT
BUR MATCHSTICK NEURO 3.0 LAGG (BURR) ×3 IMPLANT
CANISTER SUCT 3000ML PPV (MISCELLANEOUS) ×3 IMPLANT
CAP LCK SPNE (Orthopedic Implant) ×4 IMPLANT
CAP LOCK SPINE RADIUS (Orthopedic Implant) ×4 IMPLANT
CAP LOCKING (Orthopedic Implant) ×8 IMPLANT
CARTRIDGE OIL MAESTRO DRILL (MISCELLANEOUS) ×1 IMPLANT
CLOSURE WOUND 1/2 X4 (GAUZE/BANDAGES/DRESSINGS) ×3
CONT SPEC 4OZ CLIKSEAL STRL BL (MISCELLANEOUS) ×3 IMPLANT
COVER BACK TABLE 60X90IN (DRAPES) ×3 IMPLANT
DECANTER SPIKE VIAL GLASS SM (MISCELLANEOUS) ×3 IMPLANT
DERMABOND ADVANCED (GAUZE/BANDAGES/DRESSINGS) ×2
DERMABOND ADVANCED .7 DNX12 (GAUZE/BANDAGES/DRESSINGS) ×1 IMPLANT
DEVICE INTERBODY ELEVATE 10X23 (Cage) ×6 IMPLANT
DIFFUSER DRILL AIR PNEUMATIC (MISCELLANEOUS) ×3 IMPLANT
DRAPE C-ARM 42X72 X-RAY (DRAPES) ×6 IMPLANT
DRAPE HALF SHEET 40X57 (DRAPES) IMPLANT
DRAPE INCISE IOBAN 66X45 STRL (DRAPES) ×3 IMPLANT
DRAPE LAPAROTOMY 100X72X124 (DRAPES) ×3 IMPLANT
DRAPE SURG 17X23 STRL (DRAPES) ×12 IMPLANT
DRSG OPSITE POSTOP 4X8 (GAUZE/BANDAGES/DRESSINGS) ×3 IMPLANT
DURAPREP 26ML APPLICATOR (WOUND CARE) ×3 IMPLANT
ELECT REM PT RETURN 9FT ADLT (ELECTROSURGICAL) ×3
ELECTRODE REM PT RTRN 9FT ADLT (ELECTROSURGICAL) ×1 IMPLANT
EVACUATOR 1/8 PVC DRAIN (DRAIN) IMPLANT
GAUZE SPONGE 4X4 12PLY STRL (GAUZE/BANDAGES/DRESSINGS) IMPLANT
GAUZE SPONGE 4X4 16PLY XRAY LF (GAUZE/BANDAGES/DRESSINGS) IMPLANT
GLOVE BIO SURGEON STRL SZ 6.5 (GLOVE) ×4 IMPLANT
GLOVE BIO SURGEONS STRL SZ 6.5 (GLOVE) ×2
GLOVE ECLIPSE 9.0 STRL (GLOVE) ×6 IMPLANT
GOWN STRL REUS W/ TWL LRG LVL3 (GOWN DISPOSABLE) ×2 IMPLANT
GOWN STRL REUS W/ TWL XL LVL3 (GOWN DISPOSABLE) ×4 IMPLANT
GOWN STRL REUS W/TWL LRG LVL3 (GOWN DISPOSABLE) ×4
GOWN STRL REUS W/TWL XL LVL3 (GOWN DISPOSABLE) ×8
KIT BASIN OR (CUSTOM PROCEDURE TRAY) ×3 IMPLANT
KIT TURNOVER KIT B (KITS) ×3 IMPLANT
MILL MEDIUM DISP (BLADE) ×3 IMPLANT
NEEDLE HYPO 22GX1.5 SAFETY (NEEDLE) ×3 IMPLANT
NS IRRIG 1000ML POUR BTL (IV SOLUTION) ×3 IMPLANT
OIL CARTRIDGE MAESTRO DRILL (MISCELLANEOUS) ×3
PACK LAMINECTOMY NEURO (CUSTOM PROCEDURE TRAY) ×3 IMPLANT
ROD RADIUS 40MM (Neuro Prosthesis/Implant) ×4 IMPLANT
ROD SPNL 40X5.5XNS TI RDS (Neuro Prosthesis/Implant) ×2 IMPLANT
SCREW 5.75X40M (Screw) ×9 IMPLANT
SCREW 5.75X45MM (Screw) ×3 IMPLANT
SPONGE SURGIFOAM ABS GEL 100 (HEMOSTASIS) ×3 IMPLANT
STRIP CLOSURE SKIN 1/2X4 (GAUZE/BANDAGES/DRESSINGS) ×6 IMPLANT
SUT VIC AB 0 CT1 18XCR BRD8 (SUTURE) ×2 IMPLANT
SUT VIC AB 0 CT1 8-18 (SUTURE) ×4
SUT VIC AB 2-0 CT1 18 (SUTURE) ×3 IMPLANT
SUT VIC AB 3-0 SH 8-18 (SUTURE) ×9 IMPLANT
TOWEL GREEN STERILE (TOWEL DISPOSABLE) ×3 IMPLANT
TOWEL GREEN STERILE FF (TOWEL DISPOSABLE) ×3 IMPLANT
TRAY FOLEY MTR SLVR 16FR STAT (SET/KITS/TRAYS/PACK) ×3 IMPLANT
WATER STERILE IRR 1000ML POUR (IV SOLUTION) ×3 IMPLANT

## 2017-08-30 NOTE — Evaluation (Signed)
Occupational Therapy Evaluation Patient Details Name: Diane Nguyen MRN: [id number] DOB: Jul 02, 1985 Today's Date: 08/30/2017    History of Present Illness Pt is a 32 y.o. female s/p L5-S1 PLIF. PMHx: Arthritis, COPD, DM, HTN.   Clinical Impression   Pt reports she was independent with ADL PTA. Currently pt requires min guard assist for ADL and functional mobility. Began education on ADL, safety, and back precautions with pt and family--pt would benefit from review of ADL techniques and maintaining precautions during functional activities. Pt planning to d/c home with 24/7 supervision from family. Pt would benefit from continued skilled OT to address established goals.    Follow Up Recommendations  No OT follow up;Supervision/Assistance - 24 hour    Equipment Recommendations  3 in 1 bedside commode    Recommendations for Other Services       Precautions / Restrictions Precautions Precautions: Fall;Back Precaution Booklet Issued: Yes (comment) Required Braces or Orthoses: Spinal Brace Spinal Brace: Lumbar corset;Applied in sitting position Restrictions Weight Bearing Restrictions: No      Mobility Bed Mobility Overal bed mobility: Needs Assistance Bed Mobility: Rolling;Sidelying to Sit;Sit to Sidelying Rolling: Supervision Sidelying to sit: Min guard     Sit to sidelying: Min assist General bed mobility comments: Assist for LEs back into bed. Cues throughout for technique and sequencing. HOB flat with use of bed rails  Transfers Overall transfer level: Needs assistance Equipment used: Rolling walker (2 wheeled) Transfers: Sit to/from Stand Sit to Stand: Min guard         General transfer comment: Cues for hand placement. Min guard for safety    Balance Overall balance assessment: Needs assistance Sitting-balance support: Feet supported Sitting balance-Leahy Scale: Good     Standing balance support: No upper extremity supported;During functional  activity Standing balance-Leahy Scale: Fair Standing balance comment: static standing during grooming task at the sink                           ADL either performed or assessed with clinical judgement   ADL Overall ADL's : Needs assistance/impaired Eating/Feeding: Set up;Sitting   Grooming: Min guard;Standing;Wash/dry hands   Upper Body Bathing: Set up;Supervision/ safety;Sitting   Lower Body Bathing: Min guard;Sit to/from stand   Upper Body Dressing : Set up;Supervision/safety;Sitting Upper Body Dressing Details (indicate cue type and reason): for gown and brace Lower Body Dressing: Min guard;Sit to/from stand Lower Body Dressing Details (indicate cue type and reason): to don underwear. Able to perform without bending Toilet Transfer: Min guard;Ambulation;Grab bars;RW;Regular Teacher, adult education Details (indicate cue type and reason): Educated pt and family on use of 3in1 over toilet Toileting- Architect and Hygiene: Supervision/safety;Sitting/lateral lean Toileting - Clothing Manipulation Details (indicate cue type and reason): for peri care, educated on proper technique without bending or twisting--pt may need review of this   Tub/Shower Transfer Details (indicate cue type and reason): Educated on use of 3 in 1 in tub as a seat Functional mobility during ADLs: Min guard;Rolling walker General ADL Comments: Educated pt on brace management and wear schedule, log roll for bed mobility, and frequent mobility throughout the day upon return home     Vision         Perception     Praxis      Pertinent Vitals/Pain Pain Assessment: Faces Faces Pain Scale: Hurts whole lot Pain Location: back Pain Descriptors / Indicators: Grimacing;Aching;Sore;Moaning Pain Intervention(s): Monitored during session     Hand Dominance  Extremity/Trunk Assessment Upper Extremity Assessment Upper Extremity Assessment: Overall WFL for tasks assessed   Lower  Extremity Assessment Lower Extremity Assessment: Defer to PT evaluation   Cervical / Trunk Assessment Cervical / Trunk Assessment: Other exceptions Cervical / Trunk Exceptions: s/p PLIF   Communication Communication Communication: No difficulties   Cognition Arousal/Alertness: Awake/alert Behavior During Therapy: Anxious Overall Cognitive Status: Within Functional Limits for tasks assessed                                     General Comments       Exercises     Shoulder Instructions      Home Living Family/patient expects to be discharged to:: Private residence Living Arrangements: Parent;Other relatives Available Help at Discharge: Family;Available 24 hours/day Type of Home: House Home Access: Level entry     Home Layout: One level     Bathroom Shower/Tub: Tub/shower unit;Curtain   Bathroom Toilet: Standard     Home Equipment: None          Prior Functioning/Environment Level of Independence: Independent                 OT Problem List: Decreased activity tolerance;Impaired balance (sitting and/or standing);Decreased knowledge of use of DME or AE;Decreased knowledge of precautions;Obesity;Pain      OT Treatment/Interventions: Self-care/ADL training;DME and/or AE instruction;Therapeutic activities;Patient/family education;Balance training    OT Goals(Current goals can be found in the care plan section) Acute Rehab OT Goals Patient Stated Goal: decrease pain OT Goal Formulation: With patient/family Time For Goal Achievement: 09/13/17 Potential to Achieve Goals: Good  OT Frequency: Min 2X/week   Barriers to D/C:            Co-evaluation PT/OT/SLP Co-Evaluation/Treatment: Yes Reason for Co-Treatment: For patient/therapist safety   OT goals addressed during session: ADL's and self-care      AM-PAC PT "6 Clicks" Daily Activity     Outcome Measure Help from another person eating meals?: None Help from another person taking care  of personal grooming?: A Little Help from another person toileting, which includes using toliet, bedpan, or urinal?: A Little Help from another person bathing (including washing, rinsing, drying)?: A Little Help from another person to put on and taking off regular upper body clothing?: None Help from another person to put on and taking off regular lower body clothing?: A Little 6 Click Score: 20   End of Session Equipment Utilized During Treatment: Rolling walker;Back brace Nurse Communication: Mobility status;Other (comment)(equipment and f/u needs)  Activity Tolerance: Patient tolerated treatment well Patient left: in bed;with call bell/phone within reach;with family/visitor present  OT Visit Diagnosis: Unsteadiness on feet (R26.81);Pain Pain - part of body: (back)                Time: [id number] OT Time Calculation (min): 33 min Charges:  OT General Charges $OT Visit: 1 Visit OT Evaluation $OT Eval Moderate Complexity: 1 Mod G-Codes:     Andrew Soria A. Brett Albinooffey, M.S., OTR/L Acute Rehab Department: [phone]  Gaye AlkenBailey A Ross Bender 08/30/2017, 4:05 PM

## 2017-08-30 NOTE — Anesthesia Postprocedure Evaluation (Signed)
Anesthesia Post Note  Patient: Diane Nguyen  Procedure(s) Performed: Posterior Lumbar Interbody Fusion - Lumar Five-Sacral One with pedicle screws (N/A Back)     Patient location during evaluation: PACU Anesthesia Type: General Level of consciousness: awake and alert Pain management: pain level controlled Vital Signs Assessment: post-procedure vital signs reviewed and stable Respiratory status: spontaneous breathing, nonlabored ventilation, respiratory function stable and patient connected to nasal cannula oxygen Cardiovascular status: blood pressure returned to baseline and stable Postop Assessment: no apparent nausea or vomiting Anesthetic complications: no    Last Vitals:  Vitals:   08/30/17 1245 08/30/17 1305  BP: 124/70 134/76  Pulse: (!) 129 (!) 115  Resp: (!) 31 14  Temp:  36.9 C  SpO2: 93% 94%    Last Pain:  Vitals:   08/30/17 1442  TempSrc:   PainSc: 10-Worst pain ever                 [name] COKER

## 2017-08-30 NOTE — H&P (Signed)
Diane Nguyen is an 32 y.o. female.   Chief Complaint: Back pain HPI: 32 year old female with chronic and progressive back pain with worsening left lower extremity radicular pain failing conservative management.  Work-up demonstrates evidence of spondylolysis with a grade 1 lytic spondylolisthesis and a leftward L5-S1 foraminal disc herniation with marked compression of the left L5 nerve root.  Patient is failed conservative management presents now for decompression and fusion surgery in hopes of improving her symptoms.  Past Medical History:  Diagnosis Date  . Arthritis   . COPD (chronic obstructive pulmonary disease) (HCC)   . Diabetes mellitus without complication (HCC)   . Headache   . Henoch-Schonlein purpura (HCC)    as a child  . Hypertension   . Seasonal allergies     No past surgical history on file.  No family history on file. Social History:  reports that she has never smoked. She has never used smokeless tobacco. She reports that she drinks alcohol. She reports that she does not use drugs.  Allergies:  Allergies  Allergen Reactions  . Penicillins Hives and Other (See Comments)    Childhood allergy Has patient had a PCN reaction causing immediate rash, facial/tongue/throat swelling, SOB or lightheadedness with hypotension: Unknown Has patient had a PCN reaction causing severe rash involving mucus membranes or skin necrosis: Unknown  PATIENT HAS HAD A PCN REACTION THAT REQUIRED HOSPITALIZATION:  #  #  YES  #  #   Has patient had a PCN reaction occurring within the last 10 years: No   . Adhesive [Tape]     Some stronger tapes rip off skin  . Bee Venom Hives and Rash  . Soy Allergy Itching and Nausea Only    Facial flushing, lightheaded   . Sulfur Hives    Medications Prior to Admission  Medication Sig Dispense Refill  . albuterol (PROVENTIL HFA;VENTOLIN HFA) 108 (90 Base) MCG/ACT inhaler Inhale 2 puffs into the lungs every 6 (six) hours as needed for wheezing or  shortness of breath.    Marland Kitchen. atorvastatin (LIPITOR) 20 MG tablet Take 20 mg by mouth at bedtime.  2  . Biotin 1000 MCG tablet Take 1,000 mcg by mouth at bedtime.    Marland Kitchen. glipiZIDE (GLUCOTROL) 10 MG tablet Take 10 mg by mouth 2 (two) times daily.  5  . JARDIANCE 25 MG TABS tablet Take 25 mg by mouth daily.  3  . Ketotifen Fumarate (EYE ITCH RELIEF OP) Place 1 drop into both eyes daily as needed (allergies/itching).    Marland Kitchen. lisinopril (PRINIVIL,ZESTRIL) 10 MG tablet Take 10 mg by mouth daily.  3  . Menthol, Topical Analgesic, (ICY HOT EX) Apply 1 application topically daily as needed (pain).    . metFORMIN (GLUCOPHAGE) 1000 MG tablet Take 1,000 mg by mouth 2 (two) times daily.  0  . cetirizine (ZYRTEC) 10 MG tablet Take 10 mg by mouth daily as needed for allergies.    . cyclobenzaprine (FLEXERIL) 10 MG tablet Take 10 mg by mouth at bedtime as needed (sleep).    . fluticasone (FLONASE) 50 MCG/ACT nasal spray Place 1 spray into both nostrils daily as needed for allergies or rhinitis.    Marland Kitchen. ibuprofen (ADVIL,MOTRIN) 200 MG tablet Take 600-800 mg by mouth daily as needed for headache.    Marland Kitchen. omeprazole (PRILOSEC OTC) 20 MG tablet Take 20 mg by mouth daily as needed (acid reflux).    . Tolnaftate (ANTIFUNGAL EX) Apply 1 application topically daily as needed (fungus).  Results for orders placed or performed during the hospital encounter of 08/30/17 (from the past 48 hour(s))  Pregnancy, urine POC     Status: None   Collection Time: 08/30/17  6:39 AM  Result Value Ref Range   Preg Test, Ur NEGATIVE NEGATIVE    Comment:        THE SENSITIVITY OF THIS METHODOLOGY IS >24 mIU/mL    No results found.  Pertinent items noted in HPI and remainder of comprehensive ROS otherwise negative.  Blood pressure 113/73, pulse 85, temperature 98.4 F (36.9 C), temperature source Oral, resp. rate 18, height 5' 5.5" (1.664 m), weight 113.9 kg (251 lb), last menstrual period 07/31/2017, SpO2 98 %.  Patient is awake and  alert.  She is oriented and appropriate.  Cranial nerve function intact.  Speech is fluent.  Judgment and insight are intact.  Motor examination extremities reveals mild weakness of dorsiflexion and significant weakness of her left extensor hallucis longus.  Remainder of her motor strength intact.  Sensory examination with decreased sensation pinprick light touch in her left L5 dermatome.  Deep tendon reflexes normal active.  No evidence of long track signs.  Gait antalgic.  Posture somewhat flexed.  Examination head ears eyes nose throat is unremarkable her chest and abdomen are benign.  Extremities are free from injury deformity. Assessment/Plan Grade 1 L5-S1 lytic spondylolisthesis with severe foraminal stenosis and radiculopathy.  Plan L5-S1 Gill procedure with bilateral L5 and S1 decompressive foraminotomies.  Followed by posterior lumbar interbody fusion utilizing interbody cages, locally harvested autograft, and augmented with posterior lateral arthrodesis utilizing nonsegmental pedicle screw fixation and local autografting.  Risks and benefits been explained.  Patient wishes to proceed. Diane Nguyen 08/30/2017, 7:51 AM

## 2017-08-30 NOTE — Anesthesia Preprocedure Evaluation (Signed)
Anesthesia Evaluation  Patient identified by MRN, date of birth, ID band Patient awake    Reviewed: Allergy & Precautions, NPO status , Patient's Chart, lab work & pertinent test results  Airway Mallampati: III  TM Distance: >3 FB Neck ROM: Full    Dental  (+) Teeth Intact, Dental Advisory Given   Pulmonary    breath sounds clear to auscultation       Cardiovascular hypertension,  Rhythm:Regular Rate:Normal     Neuro/Psych    GI/Hepatic   Endo/Other  diabetes  Renal/GU      Musculoskeletal   Abdominal (+) + obese,   Peds  Hematology   Anesthesia Other Findings   Reproductive/Obstetrics                             Anesthesia Physical Anesthesia Plan  ASA: III  Anesthesia Plan: General   Post-op Pain Management:    Induction: Intravenous  PONV Risk Score and Plan: Ondansetron and Scopolamine patch - Pre-op  Airway Management Planned: Oral ETT  Additional Equipment:   Intra-op Plan:   Post-operative Plan: Extubation in OR  Informed Consent: I have reviewed the patients History and Physical, chart, labs and discussed the procedure including the risks, benefits and alternatives for the proposed anesthesia with the patient or authorized representative who has indicated his/her understanding and acceptance.   Dental advisory given  Plan Discussed with: CRNA and Anesthesiologist  Anesthesia Plan Comments:         Anesthesia Quick Evaluation

## 2017-08-30 NOTE — Transfer of Care (Signed)
Immediate Anesthesia Transfer of Care Note  Patient: Diane Nguyen  Procedure(s) Performed: Posterior Lumbar Interbody Fusion - Lumar Five-Sacral One with pedicle screws (N/A Back)  Patient Location: PACU  Anesthesia Type:General  Level of Consciousness: awake, alert , oriented and sedated  Airway & Oxygen Therapy: Patient Spontanous Breathing and Patient connected to nasal cannula oxygen  Post-op Assessment: Report given to RN, Post -op Vital signs reviewed and stable and Patient moving all extremities  Post vital signs: Reviewed and stable  Last Vitals:  Vitals Value Taken Time  BP 109/60 08/30/2017 11:40 AM  Temp    Pulse 122 08/30/2017 11:48 AM  Resp 25 08/30/2017 11:48 AM  SpO2 97 % 08/30/2017 11:48 AM  Vitals shown include unvalidated device data.  Last Pain:  Vitals:   08/30/17 0646  TempSrc:   PainSc: 5          Complications: No apparent anesthesia complications

## 2017-08-30 NOTE — Brief Op Note (Signed)
08/30/2017  11:23 AM  PATIENT:  Arlyss RepressLaura Greenley  32 y.o. female  PRE-OPERATIVE DIAGNOSIS:  HNP - Spondylolisthesis  POST-OPERATIVE DIAGNOSIS:  HNP - Spondylolisthesis  PROCEDURE:  Procedure(s): Posterior Lumbar Interbody Fusion - Lumar Five-Sacral One with pedicle screws (N/A)  SURGEON:  Surgeon(s) and Role:    * Julio SicksPool, Zuriah Bordas, MD - Primary  PHYSICIAN ASSISTANT:   ASSISTANTS:    ANESTHESIA:   general  EBL:  200 mL   BLOOD ADMINISTERED:none  DRAINS: none   LOCAL MEDICATIONS USED:  MARCAINE     SPECIMEN:  No Specimen  DISPOSITION OF SPECIMEN:  N/A  COUNTS:  YES  TOURNIQUET:  * No tourniquets in log *  DICTATION: .Dragon Dictation  PLAN OF CARE: Admit to inpatient   PATIENT DISPOSITION:  PACU - hemodynamically stable.   Delay start of Pharmacological VTE agent (>24hrs) due to surgical blood loss or risk of bleeding: yes

## 2017-08-30 NOTE — Op Note (Signed)
Date of procedure: 08/30/2017  Date of dictation: Same  Service: Neurosurgery  Preoperative diagnosis: L5-S1 grade 1 lytic spondylolisthesis with severe foraminal stenosis  Postoperative diagnosis: Same  Procedure Name: Bilateral L5 Gill procedure with bilateral L5 and S1 decompressive  foraminotomies.  L5-S1 posterior lumbar interbody fusion utilizing interbody cages, locally harvested autograft  L5-S1 posterior lateral arthrodesis utilizing nonsegmental pedicle screw fixation and local autograft  Surgeon:Broderick Fonseca A.Hong Moring, M.D.  Asst. Surgeon: None  Anesthesia: General  Indication: 32 year old female with chronic and worsening back and bilateral lower extremity pain left greater than right.  Work-up demonstrates evidence of a lytic grade 1 L5-S1 spondylolisthesis with marked foraminal stenosis and a broad-based disc herniation off to the left side causing severe compression of her left L5 nerve root.  Patient is failed conservative management and presents now for decompression and fusion surgery  Operative note: After induction of anesthesia, patient position prone on the Wilson frame and appropriately padded.  Lumbar region prepped and draped sterilely.  Incision made overlying L5-S1.  Dissection performed bilaterally.  Retractor placed.  Fluoroscopy used.  Level confirmed.  Gill procedure then performed using Leksell rongeurs and Kerrison Rogers to remove the entire lamina of L5 as well as the inferior facets of L5 bilaterally.  Rudimentary inferior facets of L5 above the level of the pars defect were also resected and foraminotomies were completed on the course the exiting L5 and S1 nerve roots.  Epidural venous plexus coagulated and cut.  Bilateral discectomies then performed including discectomy on the left side with resection of her broad-based disc herniation on the left side.  Disc spaces and prepared for interbody fusion.  With a distractor placed the patient's right side the space was  cleaned of all soft tissue on the left side.  A 10 mm [employer] expandable cage packed with locally harvested autograft was then impacted into place and expanded to its full extent.  Distractor removed the patient's contralateral side.  The space prepared on the contralateral side.  Morselized autograft packed in the interspace.  A second cage was then impacted into place and expanded to its full extent.  Pedicles of L5 and S1 were then identified using surface landmarks and intraoperative fluoroscopy.  Superficial bone overlying the pedicle was then removed using a high-speed drill.  Each pedicle was then probed using a pedicle all each pedicle tract was then probed and found to be solidly within the bone.  5.75 mm radius bran screws from [employer] were placed bilaterally.  Final images reveal good position of the cages and the hardware at the proper operative level with normal alignment of the spine.  Wound was then irrigated fanlike solution.  Transverse processes and residual facets were then decorticated using a high-speed drill.  Morselized autograft was packed posterior laterally for later fusion.  Short segment titanium rod placed over the screw heads at L5 and S1.  Locking caps placed over the screws.  Locking caps and engaged.  Wound was then inspected for hemostasis found to be good.  Gelfoam was placed topically over the laminectomy defect.  Vancomycin powder was placed in the deep wound space.  Wounds and closed in layers with Vicryl sutures.  Steri-Strips and sterile dressing were applied.  There were no apparent complications.  Patient tolerated the procedure well and she returns to the recovery room postop.

## 2017-08-30 NOTE — Evaluation (Signed)
Physical Therapy Evaluation Patient Details Name: Diane Nguyen MRN: [id number] DOB: 03-25-1985 Today's Date: 08/30/2017   History of Present Illness  Pt is a 32 y.o. female s/p L5-S1 PLIF. PMHx: Arthritis, COPD, DM, HTN.  Clinical Impression  Orders received for PT evaluation. Patient demonstrates deficits in functional mobility as indicated below. Will benefit from continued skilled PT to address deficits and maximize function. Will see as indicated and progress as tolerated.  Patient receptive to education but easily distracted by pain. Max cues throughout session.    Follow Up Recommendations No PT follow up;Supervision for mobility/OOB    Equipment Recommendations  Rolling walker with 5" wheels;3in1 (PT)    Recommendations for Other Services       Precautions / Restrictions Precautions Precautions: Fall;Back Precaution Booklet Issued: Yes (comment) Required Braces or Orthoses: Spinal Brace Spinal Brace: Lumbar corset;Applied in sitting position Restrictions Weight Bearing Restrictions: No      Mobility  Bed Mobility Overal bed mobility: Needs Assistance Bed Mobility: Rolling;Sidelying to Sit;Sit to Sidelying Rolling: Supervision Sidelying to sit: Min guard     Sit to sidelying: Min assist General bed mobility comments: Assist for LEs back into bed. Cues throughout for technique and sequencing. HOB flat with use of bed rails  Transfers Overall transfer level: Needs assistance Equipment used: Rolling walker (2 wheeled) Transfers: Sit to/from Stand Sit to Stand: Min guard         General transfer comment: Cues for hand placement. Min guard for safety  Ambulation/Gait Ambulation/Gait assistance: Supervision Gait Distance (Feet): 160 Feet Assistive device: Rolling walker (2 wheeled) Gait Pattern/deviations: Step-through pattern;Decreased stride length;Antalgic Gait velocity: decreased Gait velocity interpretation: <1.31 ft/sec, indicative of household  ambulator General Gait Details: patient very slow and guard, relaince on RW for comfort. Max cues to increase speed and cadence. No overt LOB  Stairs            Wheelchair Mobility    Modified Rankin (Stroke Patients Only)       Balance Overall balance assessment: Needs assistance Sitting-balance support: Feet supported Sitting balance-Leahy Scale: Good     Standing balance support: No upper extremity supported;During functional activity Standing balance-Leahy Scale: Fair Standing balance comment: static standing during grooming task at the sink                             Pertinent Vitals/Pain Pain Assessment: Faces Faces Pain Scale: Hurts whole lot Pain Location: back Pain Descriptors / Indicators: Grimacing;Aching;Sore;Moaning Pain Intervention(s): Monitored during session    Home Living Family/patient expects to be discharged to:: Private residence Living Arrangements: Parent;Other relatives Available Help at Discharge: Family;Available 24 hours/day Type of Home: House Home Access: Level entry     Home Layout: One level Home Equipment: None      Prior Function Level of Independence: Independent               Hand Dominance   Dominant Hand: Right    Extremity/Trunk Assessment   Upper Extremity Assessment Upper Extremity Assessment: Overall WFL for tasks assessed    Lower Extremity Assessment Lower Extremity Assessment: LLE deficits/detail LLE Deficits / Details: patient reports weakness and numbness in LLE LLE Coordination: decreased gross motor    Cervical / Trunk Assessment Cervical / Trunk Assessment: Other exceptions Cervical / Trunk Exceptions: s/p PLIF  Communication   Communication: No difficulties  Cognition Arousal/Alertness: Awake/alert Behavior During Therapy: Anxious Overall Cognitive Status: Within Functional Limits for tasks assessed  General Comments       Exercises     Assessment/Plan    PT Assessment Patient needs continued PT services  PT Problem List Decreased strength;Decreased activity tolerance;Decreased mobility       PT Treatment Interventions DME instruction;Gait training;Functional mobility training;Therapeutic exercise;Therapeutic activities;Patient/family education    PT Goals (Current goals can be found in the Care Plan section)  Acute Rehab PT Goals Patient Stated Goal: decrease pain PT Goal Formulation: With patient/family Time For Goal Achievement: 09/06/17 Potential to Achieve Goals: Good    Frequency Min 5X/week   Barriers to discharge        Co-evaluation PT/OT/SLP Co-Evaluation/Treatment: Yes Reason for Co-Treatment: For patient/therapist safety PT goals addressed during session: Mobility/safety with mobility OT goals addressed during session: ADL's and self-care       AM-PAC PT "6 Clicks" Daily Activity  Outcome Measure Difficulty turning over in bed (including adjusting bedclothes, sheets and blankets)?: A Lot Difficulty moving from lying on back to sitting on the side of the bed? : Unable Difficulty sitting down on and standing up from a chair with arms (e.g., wheelchair, bedside commode, etc,.)?: Unable Help needed moving to and from a bed to chair (including a wheelchair)?: A Little Help needed walking in hospital room?: A Little Help needed climbing 3-5 steps with a railing? : A Little 6 Click Score: 13    End of Session Equipment Utilized During Treatment: Gait belt;Back brace Activity Tolerance: Patient limited by fatigue;Patient limited by pain Patient left: in bed;with call bell/phone within reach;with family/visitor present Nurse Communication: Mobility status PT Visit Diagnosis: Difficulty in walking, not elsewhere classified (R26.2)    Time: 4782-9562 PT Time Calculation (min) (ACUTE ONLY): 33 min   Charges:   PT Evaluation $PT Eval Moderate Complexity: 1 Mod     PT G  Codes:        Charlotte Crumb, PT DPT  Board Certified Neurologic Specialist [phone]   Fabio Asa 08/30/2017, 5:02 PM

## 2017-08-30 NOTE — Anesthesia Procedure Notes (Signed)
Procedure Name: Intubation Date/Time: 08/30/2017 8:16 AM Performed by: Fransisca KaufmannMeyer, Aniceto Kyser E, CRNA Pre-anesthesia Checklist: Patient identified, Emergency Drugs available, Suction available and Patient being monitored Patient Re-evaluated:Patient Re-evaluated prior to induction Oxygen Delivery Method: Circle System Utilized Preoxygenation: Pre-oxygenation with 100% oxygen Induction Type: IV induction Ventilation: Mask ventilation without difficulty Laryngoscope Size: Miller and 2 Grade View: Grade I Tube type: Oral Tube size: 7.5 mm Number of attempts: 1 Airway Equipment and Method: Stylet and Oral airway Placement Confirmation: ETT inserted through vocal cords under direct vision,  positive ETCO2 and breath sounds checked- equal and bilateral Secured at: 22 cm Tube secured with: Tape Dental Injury: Teeth and Oropharynx as per pre-operative assessment

## 2017-08-31 LAB — GLUCOSE, CAPILLARY: Glucose-Capillary: 143 mg/dL — ABNORMAL HIGH (ref 70–99)

## 2017-08-31 MED ORDER — OXYCODONE-ACETAMINOPHEN 10-325 MG PO TABS: 1.0000 | ORAL_TABLET | ORAL | 0 refills | Status: AC | PRN

## 2017-08-31 MED ORDER — DIAZEPAM 5 MG PO TABS: 5.0000 mg | ORAL_TABLET | Freq: Four times a day (QID) | ORAL | 0 refills | Status: AC | PRN

## 2017-08-31 NOTE — Progress Notes (Signed)
Discharged instructions/education/Rx/AVS given to patient with parents at bedside and they all verbalized understanding. MAE well, ambulating well with walker. Pain is mild to severe but controlled by PRN pain medication. Voiding and emptying bladder well. No swelling, no drainage, no redness noted on incision site.  Discharged via wheelchair.

## 2017-08-31 NOTE — Progress Notes (Signed)
Physical Therapy Treatment Patient Details Name: Diane Nguyen MRN: [id number] DOB: 01-Oct-1985 Today's Date: 08/31/2017    History of Present Illness Pt is a 32 y.o. female s/p L5-S1 PLIF. PMHx: Arthritis, COPD, DM, HTN.    PT Comments    Patient remains very limited by pain but agreeable to participate in therapy. Ambulating 100 feet with walker utilizing a very slow gait speed and guarded posture. Verbally reviewed instructions for car transfer and activity recommendations; patient/family verbalized understanding.     Follow Up Recommendations  No PT follow up;Supervision for mobility/OOB     Equipment Recommendations  Rolling walker with 5" wheels;3in1 (PT)    Recommendations for Other Services       Precautions / Restrictions Precautions Precautions: Fall;Back Precaution Booklet Issued: Yes (comment) Required Braces or Orthoses: Spinal Brace Spinal Brace: Lumbar corset;Applied in sitting position Restrictions Weight Bearing Restrictions: No    Mobility  Bed Mobility Overal bed mobility: Needs Assistance Bed Mobility: Rolling;Sit to Sidelying Rolling: Modified independent (Device/Increase time)       Sit to sidelying: Min guard General bed mobility comments: Good technique utilized for sidelying to sit and then rolling onto back  Transfers Overall transfer level: Needs assistance Equipment used: Rolling walker (2 wheeled) Transfers: Sit to/from Stand Sit to Stand: Supervision         General transfer comment: Cues for hand placement.   Ambulation/Gait Ambulation/Gait assistance: Supervision Gait Distance (Feet): 100 Feet Assistive device: Rolling walker (2 wheeled) Gait Pattern/deviations: Step-through pattern;Decreased stride length;Antalgic Gait velocity: decreased Gait velocity interpretation: <1.31 ft/sec, indicative of household ambulator General Gait Details: patient continues to be very slow and guarded with moderate reliance on BUE's on walker.  demonstrates good posture   Stairs             Wheelchair Mobility    Modified Rankin (Stroke Patients Only)       Balance Overall balance assessment: Needs assistance Sitting-balance support: Feet supported Sitting balance-Leahy Scale: Good     Standing balance support: No upper extremity supported;During functional activity Standing balance-Leahy Scale: Fair                              Cognition Arousal/Alertness: Awake/alert Behavior During Therapy: WFL for tasks assessed/performed Overall Cognitive Status: Within Functional Limits for tasks assessed                                        Exercises      General Comments General comments (skin integrity, edema, etc.): verbally reviewed car transfer and activity recommendations      Pertinent Vitals/Pain Pain Assessment: Faces Faces Pain Scale: Hurts whole lot Pain Location: back Pain Descriptors / Indicators: Grimacing;Aching;Sore;Moaning Pain Intervention(s): Monitored during session;Patient requesting pain meds-RN notified;Limited activity within patient's tolerance    Home Living                      Prior Function            PT Goals (current goals can now be found in the care plan section) Acute Rehab PT Goals Patient Stated Goal: decrease pain PT Goal Formulation: With patient/family Time For Goal Achievement: 09/06/17 Potential to Achieve Goals: Good    Frequency    Min 5X/week      PT Plan Current plan remains appropriate    Co-evaluation  AM-PAC PT "6 Clicks" Daily Activity  Outcome Measure  Difficulty turning over in bed (including adjusting bedclothes, sheets and blankets)?: A Lot Difficulty moving from lying on back to sitting on the side of the bed? : Unable Difficulty sitting down on and standing up from a chair with arms (e.g., wheelchair, bedside commode, etc,.)?: Unable Help needed moving to and from a bed to  chair (including a wheelchair)?: A Little Help needed walking in hospital room?: A Little Help needed climbing 3-5 steps with a railing? : A Little 6 Click Score: 13    End of Session Equipment Utilized During Treatment: Gait belt;Back brace Activity Tolerance: Patient limited by pain Patient left: in bed;with call bell/phone within reach;with family/visitor present Nurse Communication: Mobility status PT Visit Diagnosis: Difficulty in walking, not elsewhere classified (R26.2)     Time: 1610-9604 PT Time Calculation (min) (ACUTE ONLY): 19 min  Charges:  $Therapeutic Activity: 8-22 mins                    G Codes:      Laurina Bustle, PT, DPT Acute Rehabilitation Services  Pager: [phone]    Vanetta Mulders 08/31/2017, 9:05 AM

## 2017-08-31 NOTE — Discharge Summary (Signed)
Physician Discharge Summary  Patient ID: Diane Nguyen MRN: [id number] DOB/AGE: 1985/12/07 32 y.o.  Admit date: 08/30/2017 Discharge date: 08/31/2017  Admission Diagnoses:  Discharge Diagnoses:  Active Problems:   Spondylolisthesis at L5-S1 level   Discharged Condition: good  Hospital Course: Patient in the hospital where she underwent uncomplicated lumbar decompression and fusion.  Postoperatively doing well.  Ambulating without much difficulty.  Preoperative radicular pain improved.  Ready for discharge home.  Consults:   Significant Diagnostic Studies:   Treatments:   Discharge Exam: Blood pressure 94/60, pulse (!) 101, temperature 98.3 F (36.8 C), temperature source Oral, resp. rate 20, height 5' 5.5" (1.664 m), weight 113.9 kg (251 lb), last menstrual period 07/31/2017, SpO2 97 %. Awake and alert.  Oriented and appropriate.  Cranial nerve function intact.  Motor and sensory function extremities normal.  Wound clean and dry.  Chest and abdomen benign.  Disposition: Discharge disposition: 01-Home or Self Care        Allergies as of 08/31/2017      Reactions   Penicillins Hives, Other (See Comments)   Childhood allergy Has patient had a PCN reaction causing immediate rash, facial/tongue/throat swelling, SOB or lightheadedness with hypotension: Unknown Has patient had a PCN reaction causing severe rash involving mucus membranes or skin necrosis: Unknown PATIENT HAS HAD A PCN REACTION THAT REQUIRED HOSPITALIZATION:  #  #  YES  #  #  Has patient had a PCN reaction occurring within the last 10 years: No   Adhesive [tape]    Some stronger tapes rip off skin   Bee Venom Hives, Rash   Soy Allergy Itching, Nausea Only   Facial flushing, lightheaded    Sulfur Hives      Medication List    TAKE these medications   albuterol 108 (90 Base) MCG/ACT inhaler Commonly known as:  PROVENTIL HFA;VENTOLIN HFA Inhale 2 puffs into the lungs every 6 (six) hours as needed for wheezing  or shortness of breath.   ANTIFUNGAL EX Apply 1 application topically daily as needed (fungus).   atorvastatin 20 MG tablet Commonly known as:  LIPITOR Take 20 mg by mouth at bedtime.   Biotin 1000 MCG tablet Take 1,000 mcg by mouth at bedtime.   cetirizine 10 MG tablet Commonly known as:  ZYRTEC Take 10 mg by mouth daily as needed for allergies.   cyclobenzaprine 10 MG tablet Commonly known as:  FLEXERIL Take 10 mg by mouth at bedtime as needed (sleep).   diazepam 5 MG tablet Commonly known as:  VALIUM Take 1-2 tablets (5-10 mg total) by mouth every 6 (six) hours as needed for muscle spasms.   EYE ITCH RELIEF OP Place 1 drop into both eyes daily as needed (allergies/itching).   fluticasone 50 MCG/ACT nasal spray Commonly known as:  FLONASE Place 1 spray into both nostrils daily as needed for allergies or rhinitis.   glipiZIDE 10 MG tablet Commonly known as:  GLUCOTROL Take 10 mg by mouth 2 (two) times daily.   ibuprofen 200 MG tablet Commonly known as:  ADVIL,MOTRIN Take 600-800 mg by mouth daily as needed for headache.   ICY HOT EX Apply 1 application topically daily as needed (pain).   JARDIANCE 25 MG Tabs tablet Generic drug:  empagliflozin Take 25 mg by mouth daily.   lisinopril 10 MG tablet Commonly known as:  PRINIVIL,ZESTRIL Take 10 mg by mouth daily.   metFORMIN 1000 MG tablet Commonly known as:  GLUCOPHAGE Take 1,000 mg by mouth 2 (two) times daily.  omeprazole 20 MG tablet Commonly known as:  PRILOSEC OTC Take 20 mg by mouth daily as needed (acid reflux).   oxyCODONE-acetaminophen 10-325 MG tablet Commonly known as:  PERCOCET Take 1-2 tablets by mouth every 4 (four) hours as needed for pain.            Durable Medical Equipment  (From admission, onward)        Start     Ordered   08/30/17 1314  DME Walker rolling  Once    Question:  Patient needs a walker to treat with the following condition  Answer:  Spondylolisthesis at L5-S1  level   08/30/17 1313   08/30/17 1314  DME 3 n 1  Once     08/30/17 1313       Signed: Kathaleen MaserHenry A Lane Kjos 08/31/2017, 7:34 AM

## 2017-08-31 NOTE — Progress Notes (Signed)
Occupational Therapy Treatment Patient Details Name: Diane Nguyen MRN: [id number] DOB: [birth year] Today's Date: 08/31/2017    History of present illness Pt is a 32 y.o. female s/p L5-S1 PLIF. PMHx: Arthritis, COPD, DM, HTN.   OT comments  Session focused on pt/caregiver education of use of AE/DME while adhering to back precautions during completion of ADLs/IADLs. Pt excited to go home and paint.  All education has been completed and the patient has no further questions, OT to sign off, pt ready to d/c home when medically stable.    Follow Up Recommendations  No OT follow up;Supervision/Assistance - 24 hour    Equipment Recommendations  3 in 1 bedside commode    Recommendations for Other Services      Precautions / Restrictions Precautions Precautions: Fall;Back Precaution Booklet Issued: Yes (comment) Required Braces or Orthoses: Spinal Brace Spinal Brace: Lumbar corset;Applied in sitting position Restrictions Weight Bearing Restrictions: No       Mobility Bed Mobility               General bed mobility comments: pt sitting in recliner upon arrival   Transfers Overall transfer level: Needs assistance Equipment used: Rolling walker (2 wheeled) Transfers: Sit to/from Stand Sit to Stand: Supervision         General transfer comment: pt adhered to back precautions    Balance Overall balance assessment: Needs assistance Sitting-balance support: Feet supported Sitting balance-Leahy Scale: Good     Standing balance support: No upper extremity supported;During functional activity Standing balance-Leahy Scale: Fair Standing balance comment: stood for dressing with minguard                           ADL either performed or assessed with clinical judgement   ADL Overall ADL's : Needs assistance/impaired                                     Functional mobility during ADLs: Min guard;Rolling walker General ADL Comments: pt  donned/doffed brace independently;educated pt on use of reacher and toilet tongs to assist with toileting and ADLs;pt donned dress standing with min guard while adhereing to all precautions;educated pt on importance of S during tub transfers;educated pt/family on use of DME to assist with ADLs     Vision       Perception     Praxis      Cognition Arousal/Alertness: Awake/alert Behavior During Therapy: WFL for tasks assessed/performed Overall Cognitive Status: Within Functional Limits for tasks assessed                                          Exercises     Shoulder Instructions       General Comments caregiver/pt educated on use of AE/DME to assist with ADLs    Pertinent Vitals/ Pain       Pain Assessment: Faces Faces Pain Scale: Hurts whole lot Pain Location: back Pain Descriptors / Indicators: Grimacing;Aching;Sore;Moaning Pain Intervention(s): Monitored during session;Patient requesting pain meds-RN notified;Limited activity within patient's tolerance  Home Living                                          Prior Functioning/Environment  Frequency           Progress Toward Goals  OT Goals(current goals can now be found in the care plan section)  Progress towards OT goals: Goals met/education completed, patient discharged from [place] Discharge plan remains appropriate;All goals met and education completed, patient discharged from OT services    Co-evaluation                 AM-PAC PT "6 Clicks" Daily Activity     Outcome Measure   Help from another person eating meals?: None Help from another person taking care of personal grooming?: A Little Help from another person toileting, which includes using toliet, bedpan, or urinal?: A Little Help from another person bathing (including washing, rinsing, drying)?: A Little Help from another person to put on and taking off regular upper body clothing?:  None Help from another person to put on and taking off regular lower body clothing?: A Little 6 Click Score: 20    End of Session Equipment Utilized During Treatment: Rolling walker;Back brace;Gait belt  OT Visit Diagnosis: Unsteadiness on feet (R26.81);Pain Pain - part of body: (back incision site)   Activity Tolerance Patient tolerated treatment well   Patient Left in chair;with call bell/phone within reach;with family/visitor present   Nurse Communication Mobility status;Patient requests pain meds        Time: 4970-2637 OT Time Calculation (min): 26 min  Charges:    Dorinda Hill OTS     Dorinda Hill 08/31/2017, 9:14 AM

## 2017-08-31 NOTE — Progress Notes (Signed)
OT Note - Addendum    08/31/17 1500  OT Visit Information  Last OT Received On 08/31/17  OT Time Calculation  OT Start Time (ACUTE ONLY) 0804  OT Stop Time (ACUTE ONLY) 0830  OT Time Calculation (min) 26 min  OT General Charges  $OT Visit 1 Visit  OT Treatments  $Self Care/Home Management  23-37 mins  Luisa DagoHilary Zaim Nitta, OT/L  OT Clinical Specialist [phone]

## 2017-08-31 NOTE — Discharge Instructions (Signed)

## 2017-09-06 MED FILL — Heparin Sodium (Porcine) Inj 1000 Unit/ML: INTRAMUSCULAR | Qty: 30 | Status: AC

## 2017-09-06 MED FILL — Sodium Chloride IV Soln 0.9%: INTRAVENOUS | Qty: 1000 | Status: AC

## 2019-07-31 IMAGING — RF DG LUMBAR SPINE 2-3V
1 series · 2 of 2 positions shown · non-contrast
Comparison: None.

CLINICAL DATA: 31-year-old female for L5-S1 interbody fusion.
Initial encounter.

EXAM:
DG C-ARM 61-120 MIN; LUMBAR SPINE - 2-3 VIEW
Fluoroscopic time: 37 seconds.

[Series 1: run · 2 of 2 slices shown]
[im 1/2]
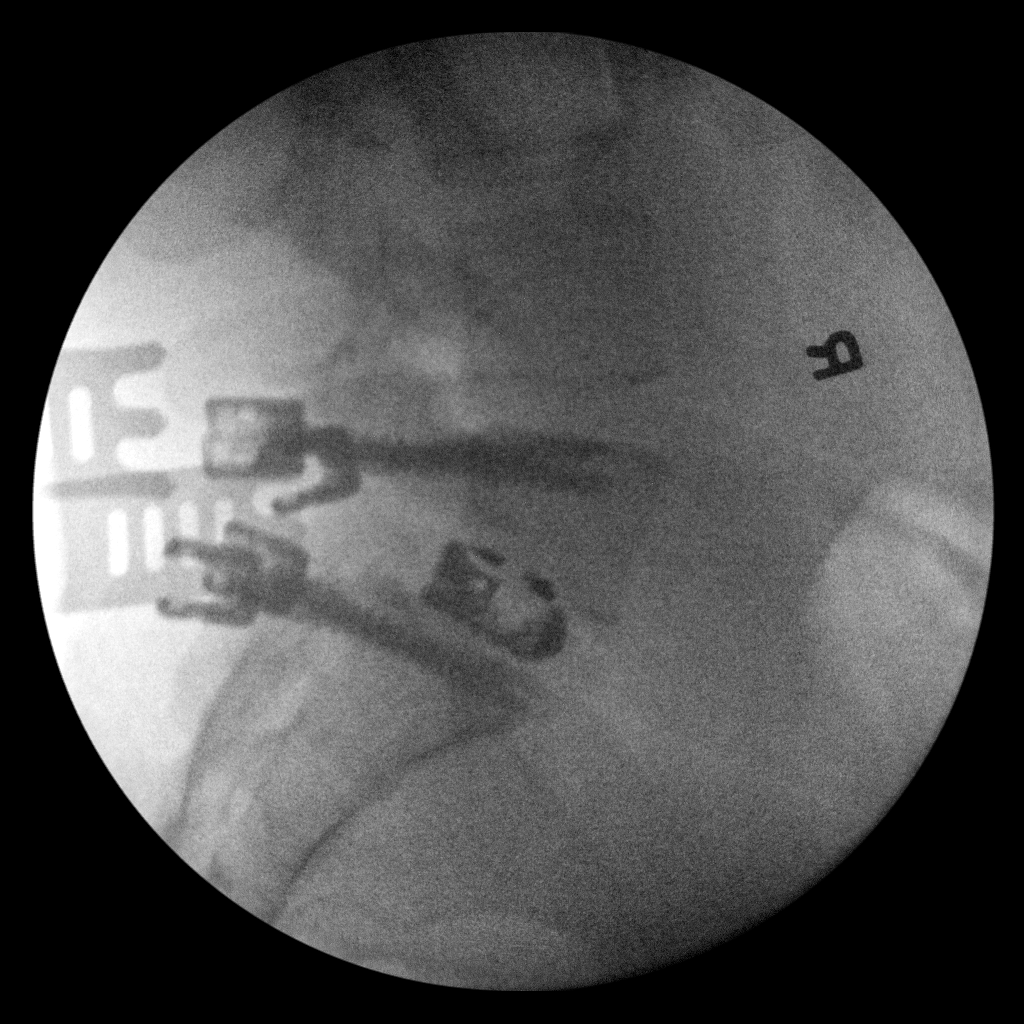
[im 2/2]
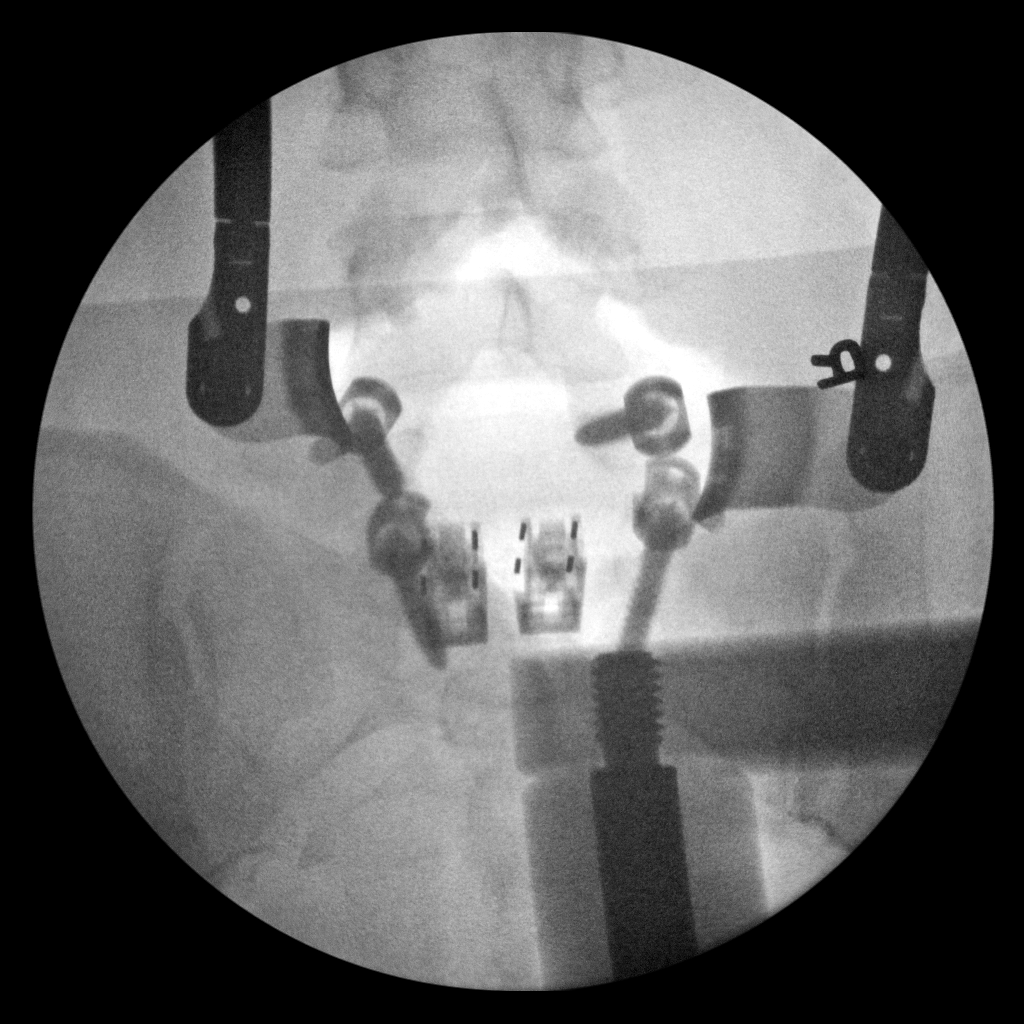

[2 of 2 positions shown; findings below may reference images not displayed]

FINDINGS: Two intraoperative C-arm views submitted for review after surgery.

Bilateral L5 and S1 pedicle screws and interbody spacer placed. No
obvious complication. This can be assessed on follow-up.
IMPRESSION: Fusion L5-S1.
